# Patient Record
Sex: Female | Born: 1975 | Race: Black or African American | Hispanic: No | Marital: Married | Smoking: Never smoker
Health system: Southern US, Community
[De-identification: ages and names within clinical notes are randomized; demographics above are authoritative.]

## PROBLEM LIST (undated history)

## (undated) DIAGNOSIS — N183 Chronic kidney disease, stage 3 unspecified: Secondary | ICD-10-CM

## (undated) DIAGNOSIS — N840 Polyp of corpus uteri: Secondary | ICD-10-CM

## (undated) DIAGNOSIS — F329 Major depressive disorder, single episode, unspecified: Secondary | ICD-10-CM

## (undated) DIAGNOSIS — E739 Lactose intolerance, unspecified: Secondary | ICD-10-CM

## (undated) DIAGNOSIS — M25474 Effusion, right foot: Secondary | ICD-10-CM

## (undated) DIAGNOSIS — M25471 Effusion, right ankle: Secondary | ICD-10-CM

## (undated) DIAGNOSIS — F32A Depression, unspecified: Secondary | ICD-10-CM

## (undated) DIAGNOSIS — M549 Dorsalgia, unspecified: Secondary | ICD-10-CM

## (undated) DIAGNOSIS — R5383 Other fatigue: Secondary | ICD-10-CM

## (undated) DIAGNOSIS — E669 Obesity, unspecified: Secondary | ICD-10-CM

## (undated) DIAGNOSIS — F419 Anxiety disorder, unspecified: Secondary | ICD-10-CM

## (undated) DIAGNOSIS — N83201 Unspecified ovarian cyst, right side: Secondary | ICD-10-CM

## (undated) DIAGNOSIS — O139 Gestational [pregnancy-induced] hypertension without significant proteinuria, unspecified trimester: Secondary | ICD-10-CM

## (undated) DIAGNOSIS — K5900 Constipation, unspecified: Secondary | ICD-10-CM

## (undated) DIAGNOSIS — D509 Iron deficiency anemia, unspecified: Secondary | ICD-10-CM

## (undated) DIAGNOSIS — N289 Disorder of kidney and ureter, unspecified: Secondary | ICD-10-CM

## (undated) DIAGNOSIS — D649 Anemia, unspecified: Secondary | ICD-10-CM

## (undated) DIAGNOSIS — I10 Essential (primary) hypertension: Secondary | ICD-10-CM

## (undated) DIAGNOSIS — M25475 Effusion, left foot: Secondary | ICD-10-CM

## (undated) DIAGNOSIS — E559 Vitamin D deficiency, unspecified: Secondary | ICD-10-CM

## (undated) DIAGNOSIS — N938 Other specified abnormal uterine and vaginal bleeding: Secondary | ICD-10-CM

## (undated) HISTORY — DX: Constipation, unspecified: K59.00

## (undated) HISTORY — DX: Anxiety disorder, unspecified: F41.9

## (undated) HISTORY — DX: Effusion, right ankle: M25.471

## (undated) HISTORY — DX: Effusion, left foot: M25.475

## (undated) HISTORY — DX: Lactose intolerance, unspecified: E73.9

## (undated) HISTORY — DX: Major depressive disorder, single episode, unspecified: F32.9

## (undated) HISTORY — DX: Effusion, right ankle: M25.474

## (undated) HISTORY — DX: Disorder of kidney and ureter, unspecified: N28.9

## (undated) HISTORY — DX: Gestational (pregnancy-induced) hypertension without significant proteinuria, unspecified trimester: O13.9

## (undated) HISTORY — DX: Vitamin D deficiency, unspecified: E55.9

## (undated) HISTORY — PX: TUBAL LIGATION: SHX77

## (undated) HISTORY — DX: Other fatigue: R53.83

## (undated) HISTORY — DX: Dorsalgia, unspecified: M54.9

## (undated) HISTORY — DX: Depression, unspecified: F32.A

## (undated) HISTORY — DX: Essential (primary) hypertension: I10

## (undated) HISTORY — DX: Anemia, unspecified: D64.9

---

## 2000-10-23 DIAGNOSIS — O139 Gestational [pregnancy-induced] hypertension without significant proteinuria, unspecified trimester: Secondary | ICD-10-CM

## 2000-10-23 HISTORY — DX: Gestational (pregnancy-induced) hypertension without significant proteinuria, unspecified trimester: O13.9

## 2003-05-23 ENCOUNTER — Emergency Department (HOSPITAL_COMMUNITY): Admission: EM | Admit: 2003-05-23 | Discharge: 2003-05-23 | Payer: Self-pay | Admitting: Emergency Medicine

## 2003-10-03 ENCOUNTER — Emergency Department (HOSPITAL_COMMUNITY): Admission: EM | Admit: 2003-10-03 | Discharge: 2003-10-03 | Payer: Self-pay | Admitting: *Deleted

## 2005-12-22 ENCOUNTER — Emergency Department (HOSPITAL_COMMUNITY): Admission: EM | Admit: 2005-12-22 | Discharge: 2005-12-22 | Payer: Self-pay | Admitting: Emergency Medicine

## 2008-05-03 ENCOUNTER — Emergency Department (HOSPITAL_COMMUNITY): Admission: EM | Admit: 2008-05-03 | Discharge: 2008-05-03 | Payer: Self-pay | Admitting: Emergency Medicine

## 2010-12-05 ENCOUNTER — Observation Stay (HOSPITAL_COMMUNITY)
Admission: AD | Admit: 2010-12-05 | Discharge: 2010-12-06 | Disposition: A | Discharge status: Home / Self Care | Payer: Self-pay | Source: Ambulatory Visit | Attending: Obstetrics & Gynecology | Admitting: Obstetrics & Gynecology

## 2010-12-05 ENCOUNTER — Inpatient Hospital Stay (HOSPITAL_COMMUNITY): Payer: Self-pay

## 2010-12-05 DIAGNOSIS — R58 Hemorrhage, not elsewhere classified: Secondary | ICD-10-CM

## 2010-12-05 DIAGNOSIS — N938 Other specified abnormal uterine and vaginal bleeding: Principal | ICD-10-CM | POA: Insufficient documentation

## 2010-12-05 DIAGNOSIS — N949 Unspecified condition associated with female genital organs and menstrual cycle: Secondary | ICD-10-CM | POA: Insufficient documentation

## 2010-12-05 DIAGNOSIS — D500 Iron deficiency anemia secondary to blood loss (chronic): Secondary | ICD-10-CM | POA: Insufficient documentation

## 2010-12-05 DIAGNOSIS — R52 Pain, unspecified: Secondary | ICD-10-CM

## 2010-12-05 LAB — SAMPLE TO BLOOD BANK

## 2010-12-05 LAB — URINALYSIS, ROUTINE W REFLEX MICROSCOPIC
Bilirubin Urine: NEGATIVE
Ketones, ur: NEGATIVE mg/dL
Leukocytes, UA: NEGATIVE
Nitrite: NEGATIVE
Protein, ur: 100 mg/dL — AB
Specific Gravity, Urine: >1.03 — ABNORMAL HIGH (ref 1.005–1.030)
Urine Glucose, Fasting: NEGATIVE mg/dL
Urobilinogen, UA: 0.2 mg/dL (ref 0.0–1.0)
pH: 6 (ref 5.0–8.0)

## 2010-12-05 LAB — WET PREP, GENITAL
Clue Cells Wet Prep HPF POC: NONE SEEN
Trich, Wet Prep: NONE SEEN
Yeast Wet Prep HPF POC: NONE SEEN

## 2010-12-05 LAB — CBC
HCT: 19.4 % — ABNORMAL LOW (ref 36.0–46.0)
Hemoglobin: 5.6 g/dL — CL (ref 12.0–15.0)
MCH: 22 pg — ABNORMAL LOW (ref 26.0–34.0)
MCHC: 28.9 g/dL — ABNORMAL LOW (ref 30.0–36.0)
MCV: 76.4 fL — ABNORMAL LOW (ref 78.0–100.0)
Platelets: 347 10*3/uL (ref 150–400)
RBC: 2.54 MIL/uL — ABNORMAL LOW (ref 3.87–5.11)
RDW: 16.1 % — ABNORMAL HIGH (ref 11.5–15.5)
WBC: 7.6 10*3/uL (ref 4.0–10.5)

## 2010-12-05 LAB — URINE MICROSCOPIC-ADD ON

## 2010-12-05 LAB — ABO/RH: ABO/RH(D): O POS

## 2010-12-06 LAB — POCT PREGNANCY, URINE: Preg Test, Ur: NEGATIVE

## 2010-12-06 LAB — DIFFERENTIAL
Basophils Absolute: 0 10*3/uL (ref 0.0–0.1)
Basophils Relative: 0 % (ref 0–1)
Eosinophils Absolute: 0.1 10*3/uL (ref 0.0–0.7)
Eosinophils Relative: 1 % (ref 0–5)
Lymphocytes Relative: 26 % (ref 12–46)
Lymphs Abs: 2.7 10*3/uL (ref 0.7–4.0)
Monocytes Absolute: 0.7 10*3/uL (ref 0.1–1.0)
Monocytes Relative: 6 % (ref 3–12)
Neutro Abs: 7.1 10*3/uL (ref 1.7–7.7)
Neutrophils Relative %: 67 % (ref 43–77)

## 2010-12-06 LAB — CBC
HCT: 31.9 % — ABNORMAL LOW (ref 36.0–46.0)
Hemoglobin: 10.3 g/dL — ABNORMAL LOW (ref 12.0–15.0)
MCH: 25.6 pg — ABNORMAL LOW (ref 26.0–34.0)
MCHC: 32.3 g/dL (ref 30.0–36.0)
MCV: 79.4 fL (ref 78.0–100.0)
Platelets: 337 10*3/uL (ref 150–400)
RBC: 4.02 MIL/uL (ref 3.87–5.11)
RDW: 15.8 % — ABNORMAL HIGH (ref 11.5–15.5)
WBC: 10.6 10*3/uL — ABNORMAL HIGH (ref 4.0–10.5)

## 2010-12-06 LAB — GC/CHLAMYDIA PROBE AMP, GENITAL
Chlamydia, DNA Probe: NEGATIVE
GC Probe Amp, Genital: NEGATIVE

## 2010-12-07 LAB — CROSSMATCH
ABO/RH(D): O POS
Antibody Screen: NEGATIVE
Unit division: 0
Unit division: 0
Unit division: 0
Unit division: 0
Unit division: 0

## 2011-01-06 NOTE — Discharge Summary (Addendum)
  NAMEMIRONDA, Elizabeth Guerra               ACCOUNT NO.:  [id number]  MEDICAL RECORD NO.:  [id number]           PATIENT TYPE:  I  LOCATION:  [id number]                          FACILITY:  [facility]  PHYSICIAN:  Emily Filbert, MD        DATE OF BIRTH:  1976/02/18  DATE OF ADMISSION:  12/05/2010 DATE OF DISCHARGE:  12/06/2010                              DISCHARGE SUMMARY   ADMISSION DIAGNOSES: 1. Dysfunctional uterine bleeding. 2. Symptomatic anemia.  DISCHARGE DIAGNOSES: 1. Status post transfusion. 2. Possible adenomyosis.  DISCHARGE MEDICATIONS:  Phenergan 25 mg one tablet p.o. q.6 h. as needed, OCPs as instructed as written on a taper.  PERTINENT LABORATORIES:  She had a hemoglobin of 5.6 and hematocrit of 19.4 on admission.  She was transfused.  Post transfusion, hemoglobin 10.3 and hematocrit 31.9.  The patient was feeling better.  Bleeding was well controlled.  Ultrasound had thickened endometrium, a large 6-cm simple cyst on the left ovary most consistent with a functional ovarian cyst.  The endometrial thickening was 18 mm and the base of the endometrium and myometrium did appear to be normal.  Uterus is normal in size and echogenicity measuring 11 x 5.5 x 7 cm.  HOSPITAL COURSE:  The patient was admitted for symptomatic anemia, dysfunctional uterine bleeding.  The patient was transfused.  The patient was started on a high dose of OCPs.  During this admission, her bleeding was much decreased.  She was sent home with a taper of her OCPs.  There were no other complications during the hospital course. She was otherwise afebrile and stable.  The patient was discharged to home.  Discharge condition was stable.  Again, discharge medications are Phenergan 25 mg one tablet p.o. q.6 h. as needed and her OCP taper as written.  FOLLOWUP:  The patient is to follow up with her PCP in about 1-2 weeks for further recommendations and management.  ER WARNINGS:  The patient will return to the emergency  department if any fever, chills, nausea/vomiting, worsening bleeding, worsening abdominal pain, or any other concerning symptoms.    ______________________________ Willaim Bane, MD   ______________________________ Emily Filbert, MD    LC/MEDQ  D:  12/26/2010  T:  12/27/2010  Job:  [id number]  Electronically Signed by Willaim Bane MD on 01/06/2011 09:32:21 AM Electronically Signed by Clovia Cuff MD on 01/20/2011 11:22:11 AM

## 2011-01-09 ENCOUNTER — Encounter: Payer: Self-pay | Admitting: Obstetrics & Gynecology

## 2011-01-09 ENCOUNTER — Encounter (INDEPENDENT_AMBULATORY_CARE_PROVIDER_SITE_OTHER): Payer: Self-pay | Admitting: Obstetrics & Gynecology

## 2011-01-09 ENCOUNTER — Other Ambulatory Visit: Payer: Self-pay | Admitting: Obstetrics & Gynecology

## 2011-01-09 ENCOUNTER — Other Ambulatory Visit (HOSPITAL_COMMUNITY)
Admission: RE | Admit: 2011-01-09 | Discharge: 2011-01-09 | Disposition: A | Discharge status: Home / Self Care | Payer: Self-pay | Source: Ambulatory Visit | Attending: Obstetrics & Gynecology | Admitting: Obstetrics & Gynecology

## 2011-01-09 DIAGNOSIS — Z01419 Encounter for gynecological examination (general) (routine) without abnormal findings: Secondary | ICD-10-CM

## 2011-01-09 DIAGNOSIS — N938 Other specified abnormal uterine and vaginal bleeding: Secondary | ICD-10-CM | POA: Insufficient documentation

## 2011-01-09 DIAGNOSIS — N920 Excessive and frequent menstruation with regular cycle: Secondary | ICD-10-CM

## 2011-01-09 DIAGNOSIS — E669 Obesity, unspecified: Secondary | ICD-10-CM

## 2011-01-09 DIAGNOSIS — N949 Unspecified condition associated with female genital organs and menstrual cycle: Secondary | ICD-10-CM | POA: Insufficient documentation

## 2011-01-09 LAB — CONVERTED CEMR LAB
Trich, Wet Prep: NONE SEEN
Yeast Wet Prep HPF POC: NONE SEEN

## 2011-01-09 LAB — POCT PREGNANCY, URINE: Preg Test, Ur: NEGATIVE

## 2011-01-11 ENCOUNTER — Other Ambulatory Visit: Payer: Self-pay

## 2011-01-13 ENCOUNTER — Encounter: Payer: Self-pay | Admitting: Obstetrics & Gynecology

## 2011-01-13 ENCOUNTER — Other Ambulatory Visit: Payer: Self-pay

## 2011-01-13 DIAGNOSIS — Z0189 Encounter for other specified special examinations: Secondary | ICD-10-CM

## 2011-01-13 LAB — CONVERTED CEMR LAB
HCT: 27.6 % — ABNORMAL LOW (ref 36.0–46.0)
HCV Ab: NEGATIVE
HIV: NONREACTIVE
Hemoglobin: 8.3 g/dL — ABNORMAL LOW (ref 12.0–15.0)
Hepatitis B Surface Ag: NEGATIVE
MCHC: 30.1 g/dL (ref 30.0–36.0)
MCV: 75.8 fL — ABNORMAL LOW (ref 78.0–100.0)
Platelets: 435 10*3/uL — ABNORMAL HIGH (ref 150–400)
Prolactin: 17.8 ng/mL
RBC: 3.64 M/uL — ABNORMAL LOW (ref 3.87–5.11)
RDW: 15.8 % — ABNORMAL HIGH (ref 11.5–15.5)
TSH: 1.504 microintl units/mL (ref 0.350–4.500)
WBC: 6.3 10*3/uL (ref 4.0–10.5)

## 2011-01-13 NOTE — Progress Notes (Signed)
NAMEELLISON, Elizabeth Guerra               ACCOUNT NO.:  [id number]  MEDICAL RECORD NO.:  [id number]           PATIENT TYPE:  A  LOCATION:  [facility]                   FACILITY:  [facility]  PHYSICIAN:  Verita Schneiders, MD     DATE OF BIRTH:  08/03/1976  DATE OF SERVICE:  01/09/2011                                 CLINIC NOTE  REASON FOR VISIT:  Menometrorrhagia.  HISTORY OF PRESENT ILLNESS:  The patient is a 35 year old gravida 2, para 2 who is here today for evaluation of menometrorrhagia.  The patient was admitted in February for menometrorrhagia and symptomatic anemia, and she was also transfused during this visit and started on oral contraceptive pills.  She was told to follow up in the clinic for endometrial biopsy and further evaluation.  Since her admission, patient does state that she has been on the birth control pills; however, she feels like they are not helping with her bleeding, which has continued since then.  She denies any lightheadedness or any other symptoms of anemia at this point, but she is interested in getting worked up for this.  Of note, patient has not seen a gynecologist since December 2010 and is also requesting an annual exam and an STD screen.  She denies any STD exposure.  She just wants this as part of her preventative health maintenance.  The patient denies any other concerns.  PAST OB/GYN HISTORY:  The patient has had two cesarean sections.  Her menarche was at age 35.  She has irregular periods and with very heavy flow, associated with mild-to-moderate pain and sometimes severe.  She does endorse intermenstrual bleeding.  She has had a tubal ligation for contraception.  Her last Pap smear was in December 2010.  She denies any abnormal Pap smears.  She denies any sexually transmitted infections.  PAST MEDICAL HISTORY:  High blood pressure, obesity, and anemia.  PAST SURGICAL HISTORY:  Cesarean sections in 2000 and 2002.  MEDICATIONS:  Lisinopril,  hydrochlorothiazide, birth control pill, tramadol, and promethazine.  ALLERGIES:  AMOXICILLIN and LATEX.  SOCIAL HISTORY:  The patient works as a Recruitment consultant.  She lives with her two sons.  She does not smoke, drink alcohol, or use any illicit drugs.  FAMILY HISTORY:  Remarkable for an extensive history of diabetes, heart disease, and high blood pressure.  REVIEW OF SYSTEMS:  Remarkable for weakness, depression, anxiety, headache, shortness of breath, dizziness and lightheadedness in the past, leg cramps rarely, decreased appetite, nausea, vomiting, abdominal pain; with fussiness and mood swings.  PHYSICAL EXAMINATION:  VITAL SIGNS:  Temperature is 98.4, blood pressure is 161/111, pulse of 89, and weight 263.2 pounds. GENERAL:  No apparent distress. LUNGS:  Clear to auscultation bilaterally. HEART:  Regular rate and rhythm. NECK:  Supple.  Normal thyroid. BREASTS:  Symmetrically sized, pendulous.  No abnormal masses, skin changes, nipple drainage, or lymphadenopathy.  ABDOMEN:  Soft, nontender, and nondistended.  Well-healed Pfannenstiel incision. PELVIC:  Normal external female genitalia.  Pink, well-rugated vagina. The patient does have some red blood visualized in the vault, and some slow trickle of blood was noted from her cervix.  Pap smear sample was  obtained, and a sample was obtained for wet prep.  On bimanual exam, her uterus could not be palpated secondary to patient's habitus. EXTREMITIES:  No cyanosis, clubbing, or edema.  ENDOMETRIAL BIOPSY:  The patient was counseled regarding needing an endometrial biopsy.  Of note, she did have an ultrasound in February which showed an 80-mm endometrial stripe and a normal uterus that was at about 11-week size and a 5-cm simple cyst in the left ovary.  The patient did agree to do this endometrial biopsy, and she had a negative urine pregnancy test.  After the Pap smear was done, her cervix was swabbed with Betadine.  Tenaculum was  placed on the anterior lip of the cervix.  The Pipelle was advanced into the uterus to a depth of about 10 cm, and this was used to obtain a moderate amount of tissue on what looked like polypoid tissue.  This sample was taken to pathology for further analysis.  The patient had some bleeding after the procedure, but tolerated procedure well.  All the instruments were removed from the patient's pelvis.  ASSESSMENT/PLAN:  The patient is a 35 year old gravida 2, para 2; here for evaluation of menometrorrhagia.  The patient also had her annual examination.  We will follow up with her Pap smear and also follow up on her endometrial biopsy results.  For her menometrorrhagia, patient was given a prescription of Provera 10 mg twice a day and also diclofenac DR to help for her cramping.  The patient will return in 2 weeks to discuss the biopsy results and further management of her menometrorrhagia.  In the meantime, she was encouraged to continue her blood pressure medicine and follow up with her PCP for better blood pressure management.          ______________________________ Verita Schneiders, MD    UA/MEDQ  D:  01/09/2011  T:  01/10/2011  Job:  [id number]

## 2011-01-25 ENCOUNTER — Other Ambulatory Visit: Payer: Self-pay | Admitting: Family Medicine

## 2011-01-25 ENCOUNTER — Ambulatory Visit (INDEPENDENT_AMBULATORY_CARE_PROVIDER_SITE_OTHER): Payer: Self-pay | Admitting: Obstetrics and Gynecology

## 2011-01-25 DIAGNOSIS — N83209 Unspecified ovarian cyst, unspecified side: Secondary | ICD-10-CM

## 2011-01-25 DIAGNOSIS — N920 Excessive and frequent menstruation with regular cycle: Secondary | ICD-10-CM

## 2011-01-26 NOTE — Progress Notes (Signed)
NAMEGERIE, SCHEUER               ACCOUNT NO.:  [id number]  MEDICAL RECORD NO.:  [id number]           PATIENT TYPE:  A  LOCATION:  [facility]                   FACILITY:  [facility]  PHYSICIAN:  Candis Musa, CNM       DATE OF BIRTH:  [birth year]  DATE OF SERVICE:  01/25/2011                                 CLINIC NOTE  REASON FOR VISIT:  Followup menometrorrhagia.  HISTORY:  Ms. Maccini was evaluated here on January 09, 2011, for her abnormal bleeding.  She is here for results.  She is a 35 year old G2, P2, who had a BTL in 2002 who had about 4 months of menometrorrhagia and was admitted here in February due to her symptomatic anemia.  She was transfused and was given a prescription for oral contraceptives.  This was started, however, this was stopped at her January 09, 2011, visit due to her severe hypertension.  At the previous visit, she had an endometrial biopsy which was completely normal.  Pap which was negative. GC and Chlamydia negative.  TSH normal.  CBC significant for hemoglobin of 8.3, MCV 75.8.  HBsAg negative, hepatitis C negative, HIV nonreactive, prolactin normal at 17.8.  RPR nonreactive and wet prep was significant for a few clue cells and for that she was given a prescription for Flagyl by phone, and she states that she took the Provera prescribed to her on January 09, 2011, for only 2 days because she had heavier bleeding with severe cramps despite the diclofenac so she therefore self discontinued that after 2 days.  She does report complete cessation of any bleeding 1 week ago.  She still is having intermittent left pelvic pain which she says often starts in her left flank and radiates to the left groin.  This has been ongoing for about 3 months and of note on her ultrasound done in December 05, 2010, she did have a large left ovarian cyst which was thought to be a functional ovarian cyst.  She has not had this followed up yet.  She is a school bus driver and is concerned  that she is sleeping very poorly only 3 hours a night because of being stressed out having 2 autistic children.  She has also had some crying spells lately.  She has lived with different partner and has a long-term plan to lose 80 pounds through [employer] in which she has already enrolled and then considering having a tubal re- anastomosis and have another child with this partner.  However on further discussion, she says if she had another episode of severe bleeding.  She would go ahead and consider the endometrial ablation instead of this plan.  For now, she wants to do expectant management.  OBJECTIVE:  BP today is 170-174/95-104.  Her weight is 264.9.  She appears in no distress.  Pelvic exam is deferred today.  IMPRESSION AND PLAN:  A 35 year old G2, P2 with morbid obesity. Menometrorrhagia without apparent cause.  We discussed her options for taking Provera on a regular basis or Depo-Provera and she elects to do expectant management for now.  She will continue high iron foods and iron supplementation.  She has diclofenac still giving her prescription today for Ambien 10 mg as needed for sleep and also discussed other measures to improve sleep and deal with her stress.  She is advised to go to her primary physician as soon as possible to adjust her antihypertensive medication and followup of her possible mood disorder and sleep disruption.  She is advised to keep a bleeding calendar, and she is getting an ultrasound to follow up the cyst in mid May and we will see [place] in June.          ______________________________ Candis Musa, CNM    DP/MEDQ  D:  01/25/2011  T:  01/26/2011  Job:  [id number]

## 2011-03-08 ENCOUNTER — Emergency Department (HOSPITAL_COMMUNITY)
Admission: EM | Admit: 2011-03-08 | Discharge: 2011-03-08 | Disposition: A | Discharge status: Home / Self Care | Payer: Self-pay | Attending: Emergency Medicine | Admitting: Emergency Medicine

## 2011-03-08 ENCOUNTER — Ambulatory Visit (HOSPITAL_COMMUNITY): Payer: Self-pay

## 2011-03-08 DIAGNOSIS — L02419 Cutaneous abscess of limb, unspecified: Secondary | ICD-10-CM | POA: Insufficient documentation

## 2011-03-08 DIAGNOSIS — L03119 Cellulitis of unspecified part of limb: Secondary | ICD-10-CM | POA: Insufficient documentation

## 2011-03-08 DIAGNOSIS — I10 Essential (primary) hypertension: Secondary | ICD-10-CM | POA: Insufficient documentation

## 2011-03-10 ENCOUNTER — Ambulatory Visit (HOSPITAL_COMMUNITY)
Admission: RE | Admit: 2011-03-10 | Discharge: 2011-03-10 | Disposition: A | Discharge status: Home / Self Care | Payer: Self-pay | Source: Ambulatory Visit | Attending: Family Medicine | Admitting: Family Medicine

## 2011-03-10 DIAGNOSIS — N83209 Unspecified ovarian cyst, unspecified side: Secondary | ICD-10-CM | POA: Insufficient documentation

## 2011-03-10 DIAGNOSIS — D259 Leiomyoma of uterus, unspecified: Secondary | ICD-10-CM | POA: Insufficient documentation

## 2011-03-26 ENCOUNTER — Inpatient Hospital Stay (INDEPENDENT_AMBULATORY_CARE_PROVIDER_SITE_OTHER)
Admission: RE | Admit: 2011-03-26 | Discharge: 2011-03-26 | Disposition: A | Discharge status: Home / Self Care | Payer: Self-pay | Source: Ambulatory Visit | Attending: Emergency Medicine | Admitting: Emergency Medicine

## 2011-03-26 DIAGNOSIS — S139XXA Sprain of joints and ligaments of unspecified parts of neck, initial encounter: Secondary | ICD-10-CM

## 2011-03-26 DIAGNOSIS — L723 Sebaceous cyst: Secondary | ICD-10-CM

## 2011-03-26 DIAGNOSIS — L089 Local infection of the skin and subcutaneous tissue, unspecified: Secondary | ICD-10-CM

## 2011-03-30 LAB — WOUND CULTURE

## 2011-04-28 ENCOUNTER — Other Ambulatory Visit: Payer: Self-pay | Admitting: Obstetrics and Gynecology

## 2011-04-28 DIAGNOSIS — N838 Other noninflammatory disorders of ovary, fallopian tube and broad ligament: Secondary | ICD-10-CM

## 2011-05-04 ENCOUNTER — Other Ambulatory Visit (HOSPITAL_COMMUNITY): Payer: Self-pay

## 2011-07-20 ENCOUNTER — Ambulatory Visit: Payer: Self-pay | Admitting: Gynecology

## 2011-07-20 LAB — DIFFERENTIAL
Basophils Absolute: 0
Basophils Relative: 0
Eosinophils Absolute: 0.2
Eosinophils Relative: 3
Lymphocytes Relative: 29
Lymphs Abs: 1.7
Monocytes Absolute: 0.5
Monocytes Relative: 9
Neutro Abs: 3.4
Neutrophils Relative %: 59

## 2011-07-20 LAB — CBC
HCT: 36.4
Hemoglobin: 11.8 — ABNORMAL LOW
MCHC: 32.6
MCV: 85.8
Platelets: 282
RBC: 4.24
RDW: 13.1
WBC: 5.9

## 2011-07-20 LAB — COMPREHENSIVE METABOLIC PANEL
ALT: 23
AST: 25
Albumin: 3.6
Alkaline Phosphatase: 50
BUN: 8
CO2: 30
Calcium: 9.5
Chloride: 99
Creatinine, Ser: 1.24 — ABNORMAL HIGH
GFR calc Af Amer: >60
GFR calc non Af Amer: 51 — ABNORMAL LOW
Glucose, Bld: 96
Potassium: 3.1 — ABNORMAL LOW
Sodium: 138
Total Bilirubin: 1
Total Protein: 7.2

## 2011-07-20 LAB — URINALYSIS, ROUTINE W REFLEX MICROSCOPIC
Bilirubin Urine: NEGATIVE
Glucose, UA: NEGATIVE
Hgb urine dipstick: NEGATIVE
Ketones, ur: NEGATIVE
Nitrite: NEGATIVE
Protein, ur: NEGATIVE
Specific Gravity, Urine: 1.024
Urobilinogen, UA: 0.2
pH: 6.5

## 2011-07-20 LAB — PREGNANCY, URINE: Preg Test, Ur: NEGATIVE

## 2011-07-20 LAB — LIPASE, BLOOD: Lipase: 16

## 2011-07-20 LAB — RPR: RPR Ser Ql: NONREACTIVE

## 2011-08-07 ENCOUNTER — Ambulatory Visit: Payer: Self-pay | Admitting: Gynecology

## 2011-08-07 ENCOUNTER — Encounter: Payer: Self-pay | Admitting: Gynecology

## 2011-08-07 ENCOUNTER — Ambulatory Visit (INDEPENDENT_AMBULATORY_CARE_PROVIDER_SITE_OTHER): Payer: BC Managed Care – PPO | Admitting: Gynecology

## 2011-08-07 VITALS — BP 136/92 | Ht 63.75 in | Wt 254.0 lb

## 2011-08-07 DIAGNOSIS — N898 Other specified noninflammatory disorders of vagina: Secondary | ICD-10-CM

## 2011-08-07 DIAGNOSIS — N920 Excessive and frequent menstruation with regular cycle: Secondary | ICD-10-CM

## 2011-08-07 DIAGNOSIS — N946 Dysmenorrhea, unspecified: Secondary | ICD-10-CM

## 2011-08-07 DIAGNOSIS — N949 Unspecified condition associated with female genital organs and menstrual cycle: Secondary | ICD-10-CM

## 2011-08-07 DIAGNOSIS — R102 Pelvic and perineal pain: Secondary | ICD-10-CM

## 2011-08-07 DIAGNOSIS — N83209 Unspecified ovarian cyst, unspecified side: Secondary | ICD-10-CM

## 2011-08-07 DIAGNOSIS — B373 Candidiasis of vulva and vagina: Secondary | ICD-10-CM

## 2011-08-07 MED ORDER — TERCONAZOLE 0.8 % VA CREA: 1.0000 | TOPICAL_CREAM | Freq: Every day | VAGINAL | Status: AC

## 2011-08-07 NOTE — Progress Notes (Signed)
35 year old G42 P2 female status post tubal sterilization presents with a history of menorrhagia and ovarian cysts discovered earlier this year. She had very heavy bleeding was evaluated at [facility] with ultrasound showing an ovarian cyst and an endometrial biopsy was done which was benign. She was started on Provera and a subsequent oral contraceptives she took these transiently her bleeding resolved but she does persist to have monthly heavy periods. She also is having more cramping with her menses and now over the last week or 2 some lower pelvic discomfort.  Her last ultrasound in May showed 2 simple left ovarian cysts one measuring 6.6 cm and the other 3.4. She was recommended to have a follow scan possibly MRI but did not follow up with him due to lack of insurance. She also notes recurrent yeast infections almost on a monthly basis following her menses and currently has a vaginal discharge with some itching.  Exam directed towards her complaint Abdomen soft nontender without masses guarding rebound organomegaly Pelvic external BUS vagina with white discharge KOH wet prep done, cervix normal, uterus a midline mobile grossly normal, adnexa without gross masses or tenderness rectovaginal exam is normal exam is somewhat limited by abdominal girth.   Assessment and plan: 1. Vaginal discharge. KOH wet prep is positive for yeast we'll treat with Terazol 3 cream follow up if symptoms persist or recur 2. History of ovarian cyst, menorrhagia dysmenorrhea pelvic pain. We'll start with sonohysterogram in follow up both of the ovaries as well as the endometrium. She does have a lab slip from a weight loss clinic where she is going to [facility] to have several bloods drawn to include a CBC and thyroid if she could bring me a copy of these results also. Patient will follow her for her ultrasound and we'll go from there. Patient does note that she is not interested in hysterectomy that she is actually  considering a tubal reversal.

## 2011-09-08 ENCOUNTER — Ambulatory Visit: Payer: BC Managed Care – PPO | Admitting: Gynecology

## 2011-09-08 ENCOUNTER — Other Ambulatory Visit: Payer: BC Managed Care – PPO

## 2011-10-03 ENCOUNTER — Other Ambulatory Visit: Payer: Self-pay | Admitting: *Deleted

## 2012-04-09 ENCOUNTER — Ambulatory Visit: Payer: BC Managed Care – PPO | Admitting: Gynecology

## 2012-05-23 IMAGING — US US PELVIS COMPLETE
1 series · 14 of 25 positions shown · non-contrast
Comparison: None.

CLINICAL DATA: Pelvic pain, vaginal bleeding

TRANSABDOMINAL ULTRASOUND OF PELVIS
TECHNIQUE: Transabdominal ultrasound examination of the pelvis was
performed including evaluation of the uterus, ovaries, adnexal
regions, and pelvic cul-de-sac.

[Series 1: us pelvis complete · 14 of 54 slices shown]
[im 1/54]
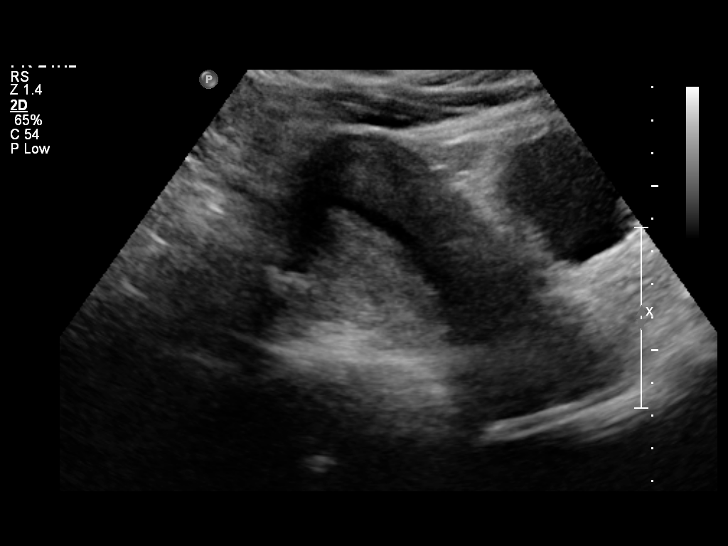
[im 5/54]
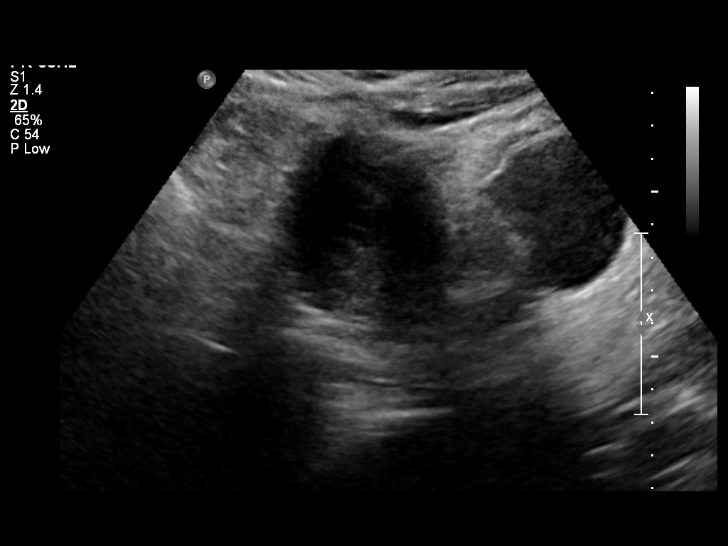
[im 9/54]
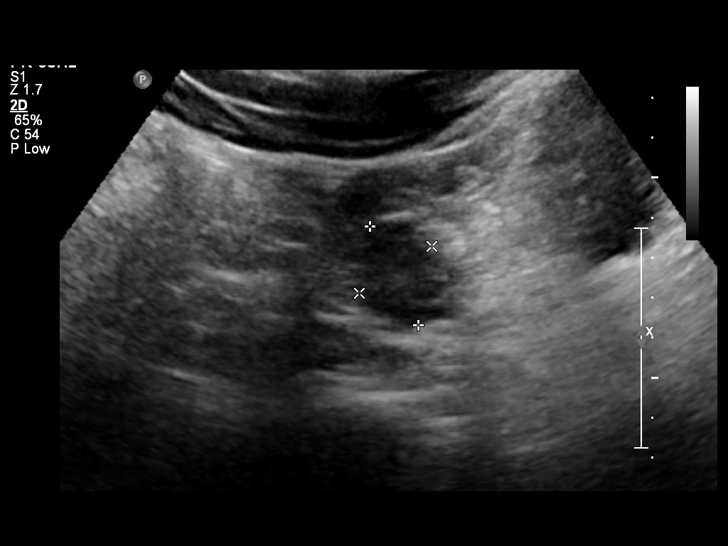
[im 14/54]
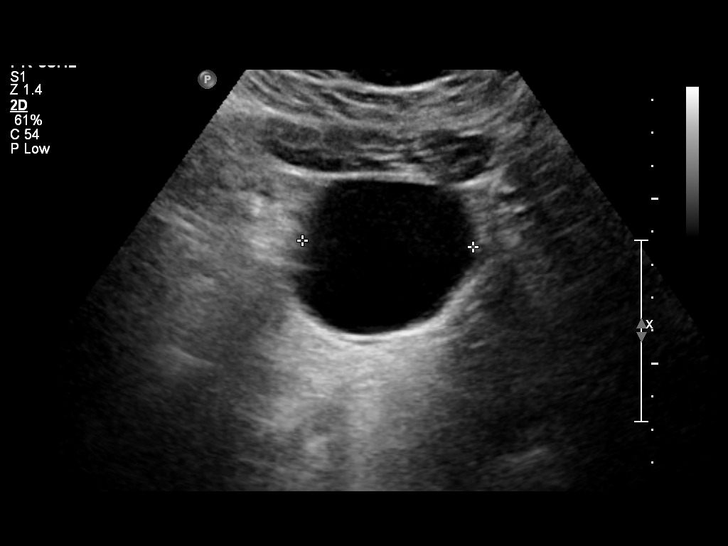
[im 18/54]
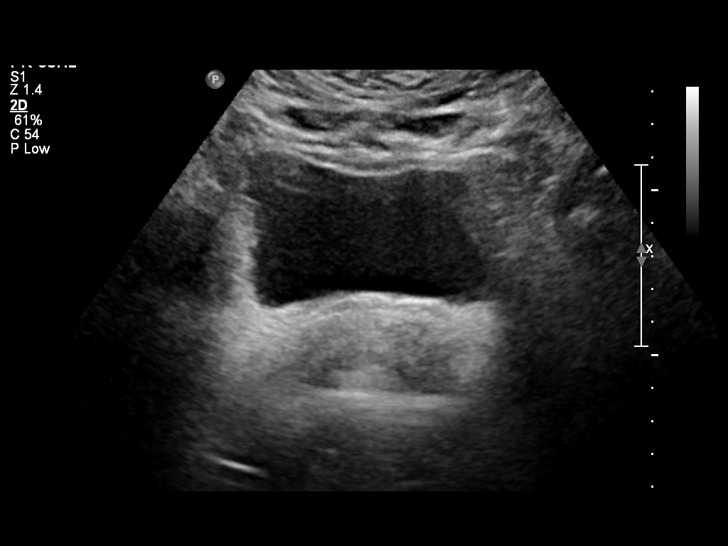
[im 20/54]
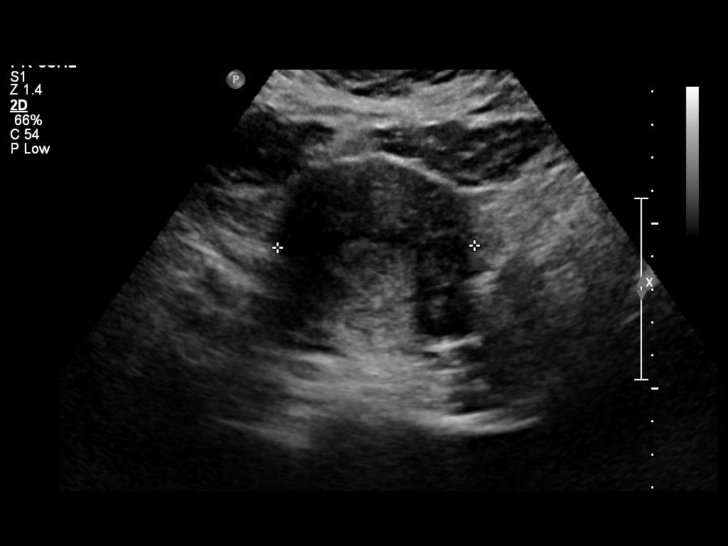
[im 25/54]
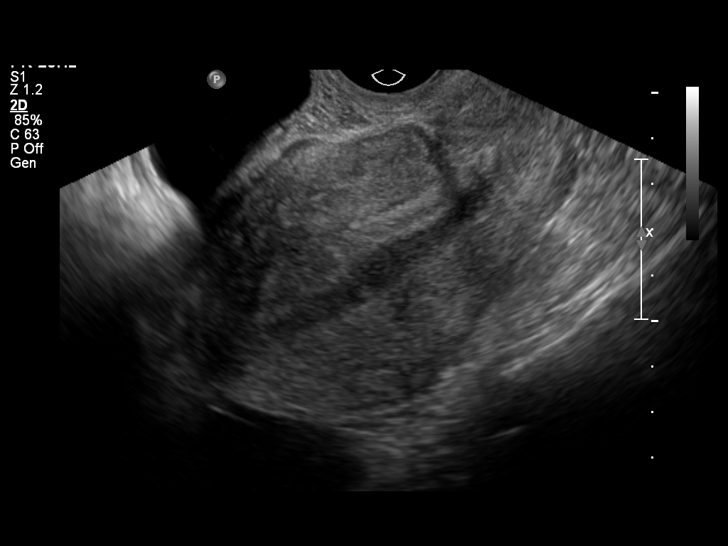
[im 29/54]
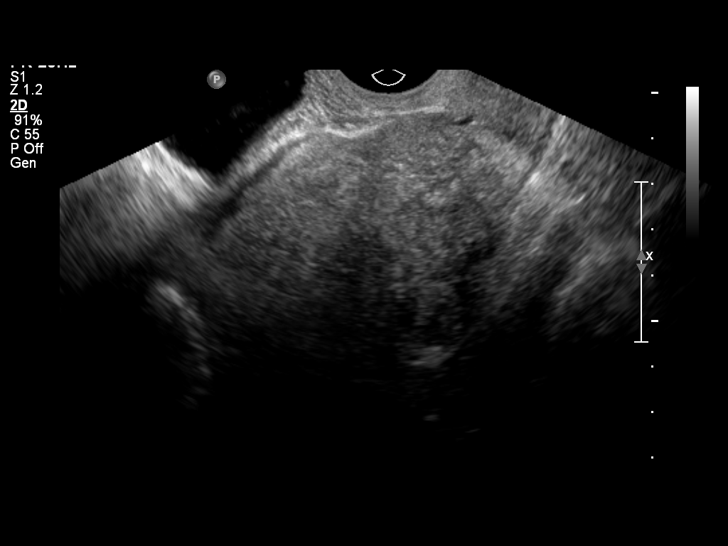
[im 34/54]
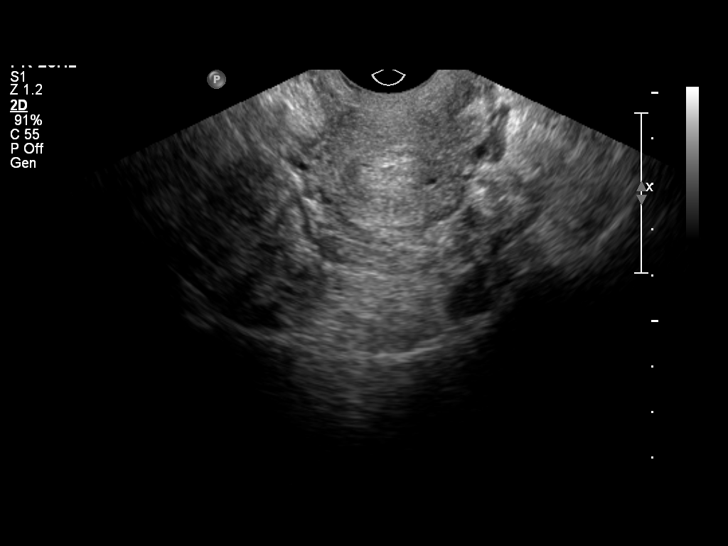
[im 36/54]
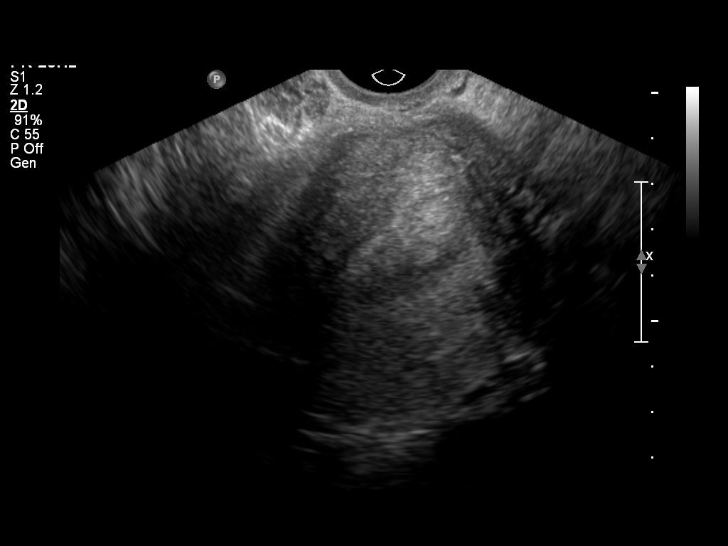
[im 40/54]
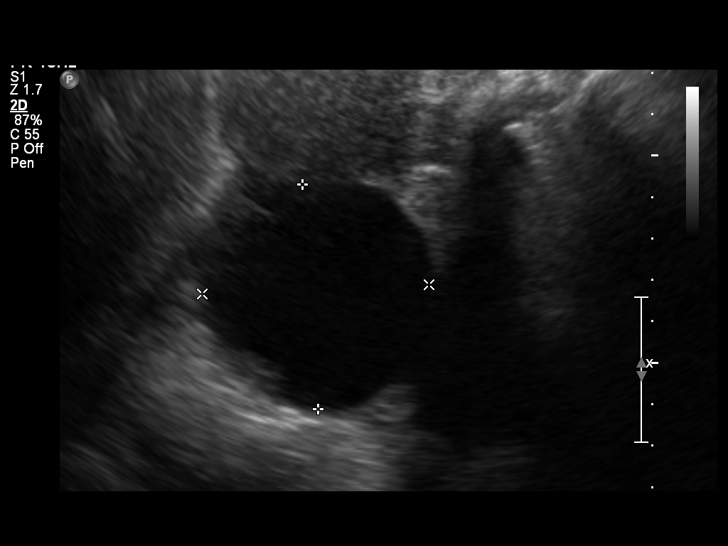
[im 45/54]
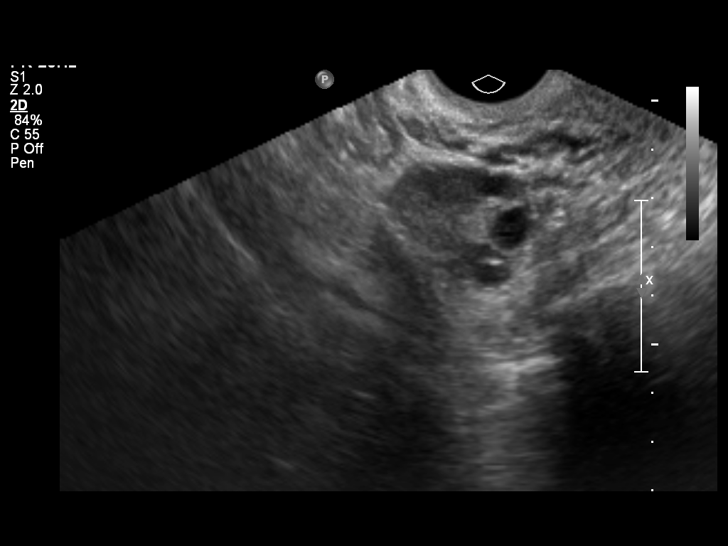
[im 49/54]
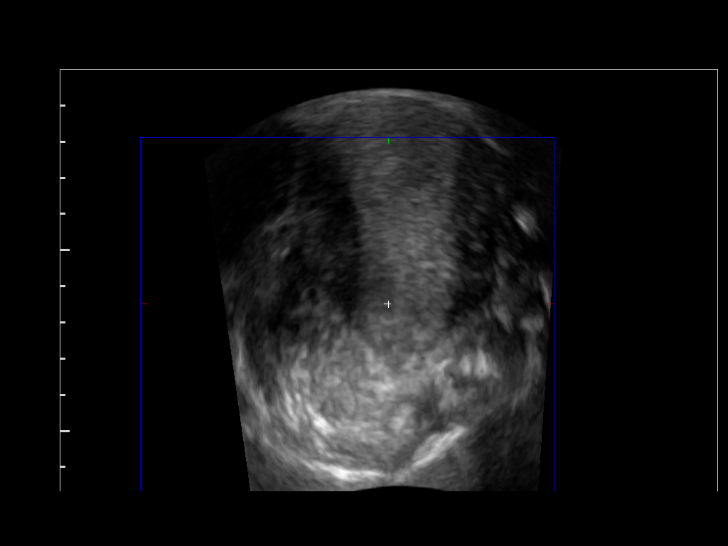
[im 54/54]
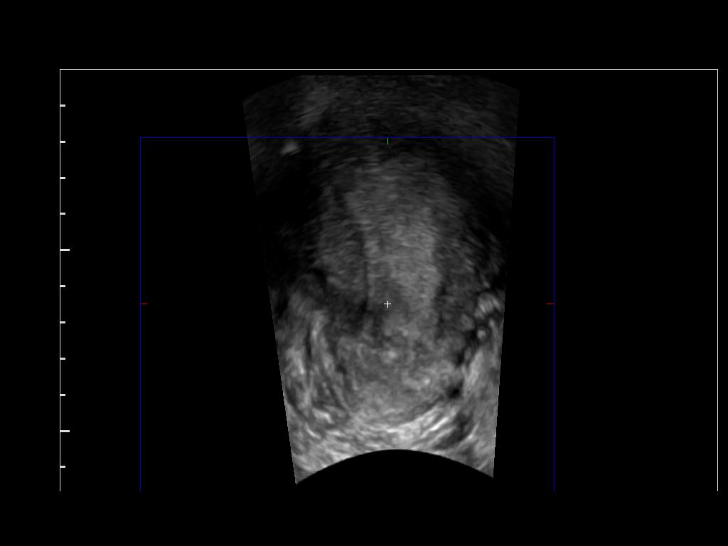

[14 of 25 positions shown; findings below may reference images not displayed]

FINDINGS: Uterus normal in size and echogenicity measuring 11.0 x 5.5 x
cm.

Endometrium endometrium is thickened to 18 mm.   The interface of
the endometrium and the myometrium appear normal.

Right Ovary normal

Left Ovary there is a 5.5 x 6.0 by 4.6 cm anechoic cyst associated
with the right ovary.  This cyst is thin-walled without complexity.

Other Findings:  Small amount free fluid.
IMPRESSION: 1.  Thickened endometrium.
2.  Large 6 cm simple cyst in the left ovary most consistent with a
functional ovarian cyst.  Consider follow-up ultrasound in 12
months.

Findings discussed with Dr. Ansitho on 12/05/2010 at 20 45 hours

## 2012-08-26 IMAGING — US US TRANSVAGINAL NON-OB
1 series · 14 of 25 positions shown · non-contrast
Comparison: December 05, 2010

CLINICAL DATA: Follow-up left ovarian cyst

TRANSVAGINAL ULTRASOUND OF PELVIS
TECHNIQUE: Transvaginal ultrasound examination of the pelvis was
performed including evaluation of the uterus, ovaries, adnexal
regions, and pelvic cul-de-sac.

[Series 1: us transvaginal non-ob · 14 of 50 slices shown]
[im 1/50]
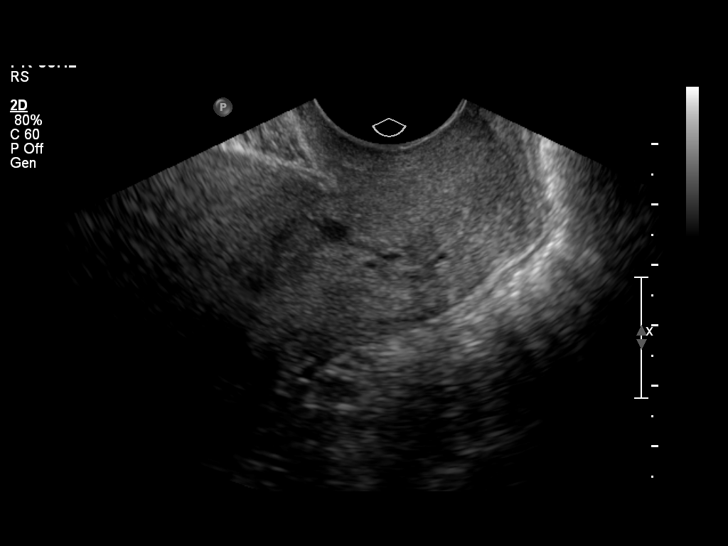
[im 5/50]
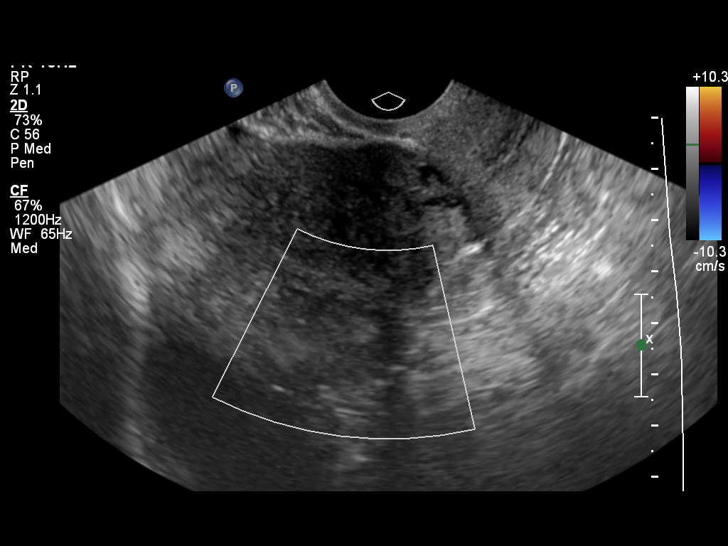
[im 9/50]
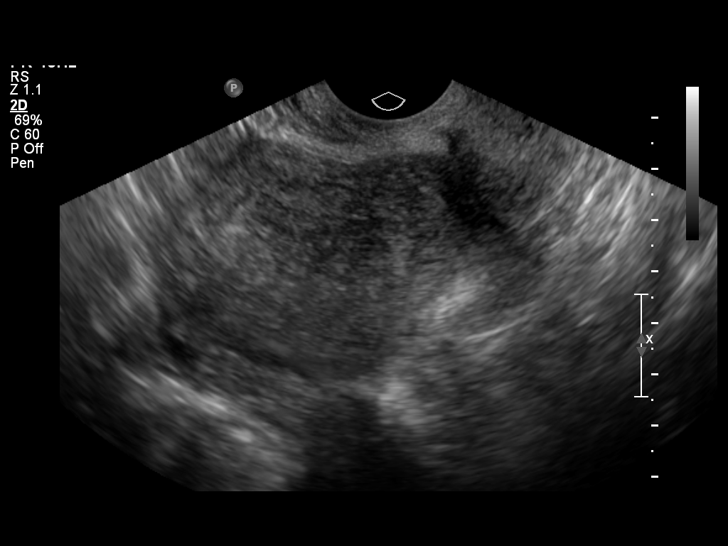
[im 13/50]
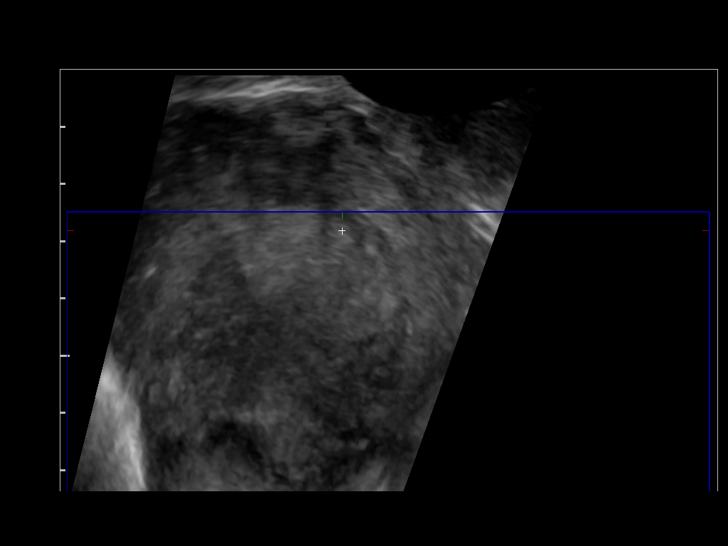
[im 17/50]
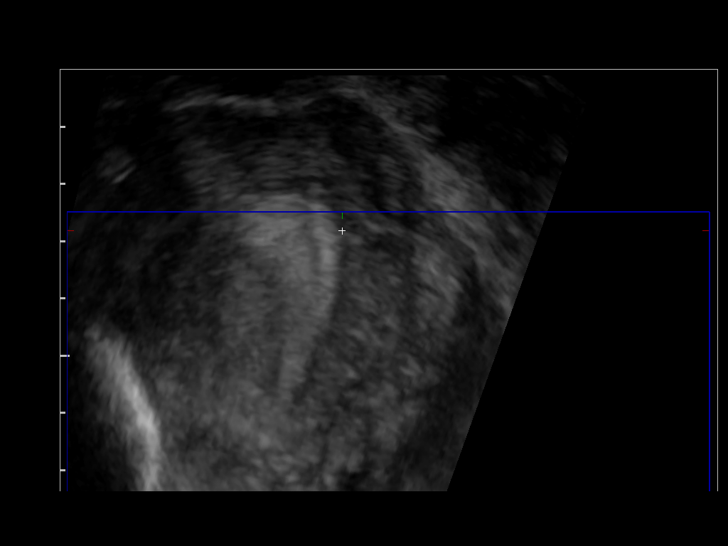
[im 19/50]
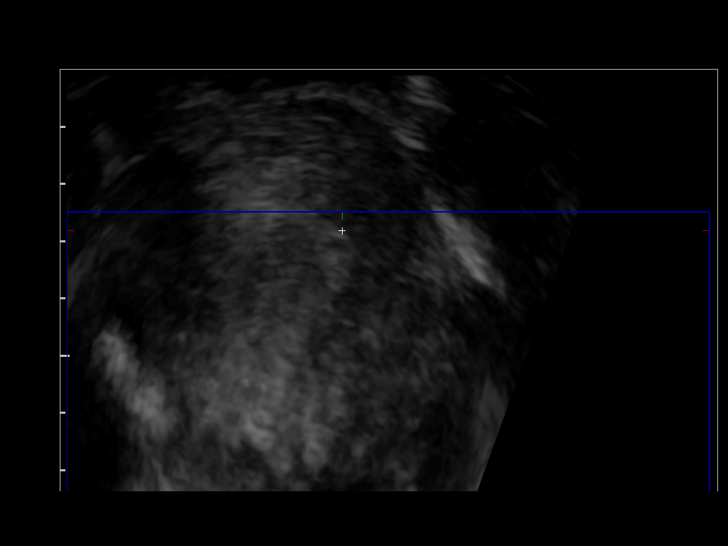
[im 23/50]
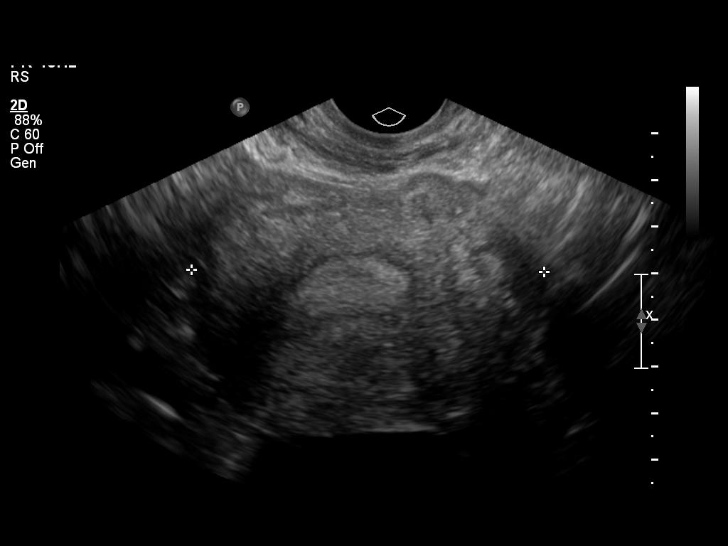
[im 27/50]
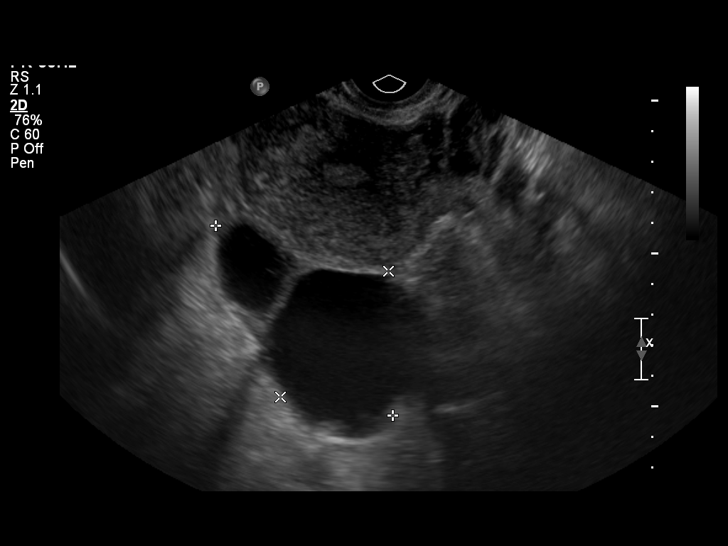
[im 31/50]
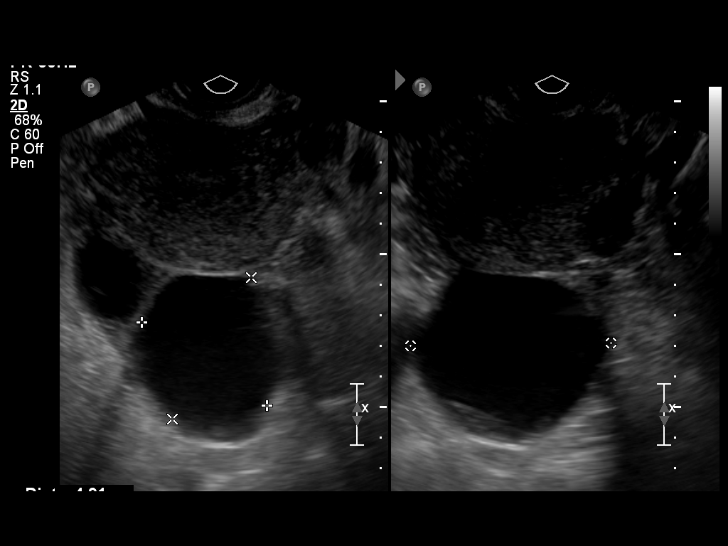
[im 33/50]
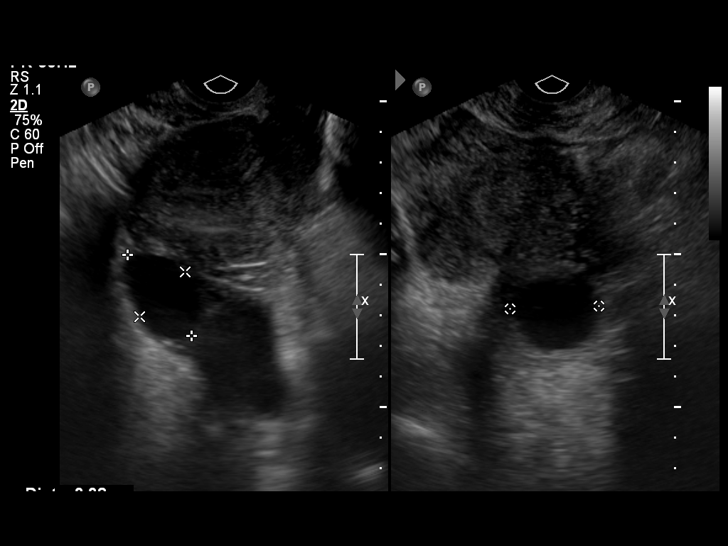
[im 37/50]
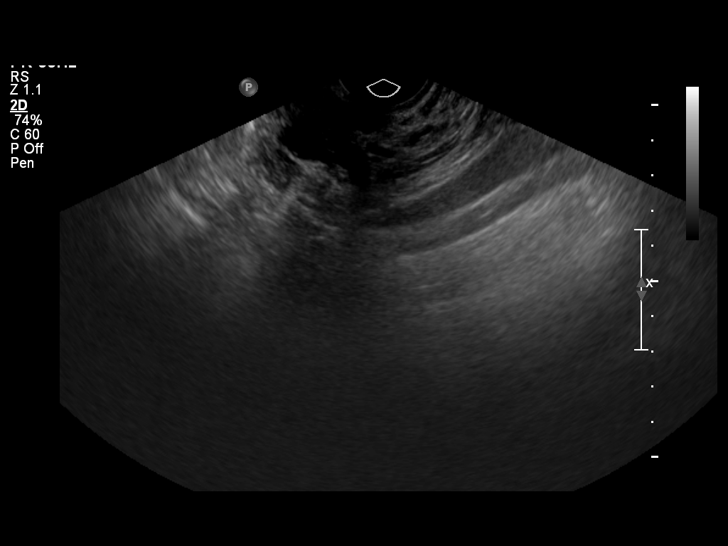
[im 41/50]
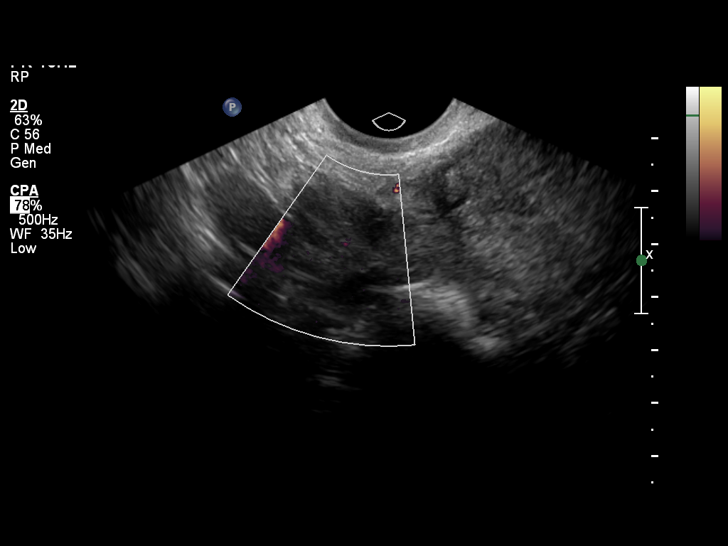
[im 45/50]
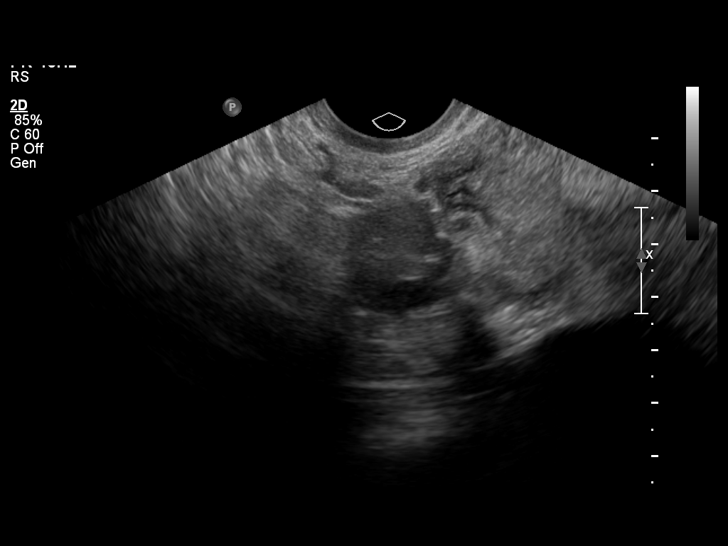
[im 50/50]
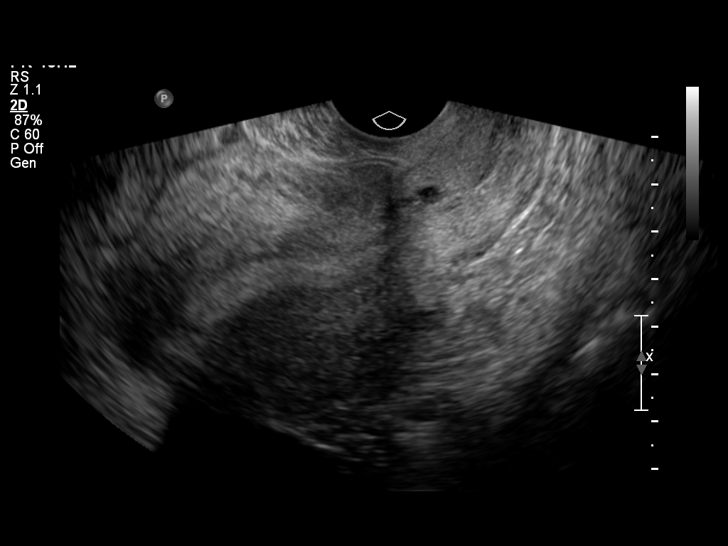

[14 of 25 positions shown; findings below may reference images not displayed]

FINDINGS: There is a 1.9 cm myometrial fibroid at the uterine
fundus.  The uterus is normal in overall size, measuring 11.0 x
x 7.6 cm.  Endometrial stripe is thin and homogeneous, measuring 13
mm in width.

On the left ovary, there are two simple appearing cysts which
measure 6.6 cm and 3.4 cm.  The overall left ovarian dimensions are
8.5 x 5.4 x 7.0 cm.  The right ovary has a normal size and
appearance, measuring 3.1 x 1.8 x 1.8 cm.  There is no free pelvic
fluid.
IMPRESSION: There is a persistent 6.6 cm cyst on the left ovary which is
slightly larger than on the prior study.  An additional 3.4 cm left
ovarian cyst is also now present.  Further characterization with
MRI or another short-term sonographic follow-up in 6 - 8 weeks is
recommended.

Uterine fibroid.

## 2012-09-04 ENCOUNTER — Ambulatory Visit (INDEPENDENT_AMBULATORY_CARE_PROVIDER_SITE_OTHER): Payer: Self-pay | Admitting: Obstetrics & Gynecology

## 2012-09-04 ENCOUNTER — Encounter: Payer: Self-pay | Admitting: Obstetrics & Gynecology

## 2012-09-04 ENCOUNTER — Other Ambulatory Visit (HOSPITAL_COMMUNITY)
Admission: RE | Admit: 2012-09-04 | Discharge: 2012-09-04 | Disposition: A | Discharge status: Home / Self Care | Payer: Self-pay | Source: Ambulatory Visit | Attending: Obstetrics & Gynecology | Admitting: Obstetrics & Gynecology

## 2012-09-04 VITALS — BP 154/94 | HR 72 | Temp 98.0°F | Ht 64.0 in | Wt 259.9 lb

## 2012-09-04 DIAGNOSIS — N8500 Endometrial hyperplasia, unspecified: Secondary | ICD-10-CM | POA: Insufficient documentation

## 2012-09-04 DIAGNOSIS — N920 Excessive and frequent menstruation with regular cycle: Secondary | ICD-10-CM

## 2012-09-04 DIAGNOSIS — Z01812 Encounter for preprocedural laboratory examination: Secondary | ICD-10-CM

## 2012-09-04 LAB — POCT PREGNANCY, URINE: Preg Test, Ur: NEGATIVE

## 2012-09-04 MED ORDER — NORGESTIMATE-ETH ESTRADIOL 0.25-35 MG-MCG PO TABS: 1.0000 | ORAL_TABLET | Freq: Every day | ORAL | Status: DC

## 2012-09-04 NOTE — Progress Notes (Signed)
States here again for bleeding problems. States will skip a period then bleed for 4 months. States has been having bleeding issues for about 9 years, will have normal periods for a while then skip and then have bleeding for a long period.

## 2012-09-04 NOTE — Progress Notes (Signed)
Patient ID: Elizabeth Guerra, female   DOB: 05-03-1976, 36 y.o.   MRN: [id number] Pt with h/o heavy cycles x 2 weeks.  She reports large clots and lots of heavy daily bleeding. This occurred last year and resolved with OCP's.  Then sx have returned.    The indications for endometrial biopsy were reviewed.   Risks of the biopsy including cramping, bleeding, infection, uterine perforation, inadequate specimen and need for additional procedures  were discussed. The patient states she understands and agrees to undergo procedure today. Consent was signed. Time out was performed. Urine HCG was negative. A sterile speculum was placed in the patient's vagina and the cervix was prepped with Betadine. A single-toothed tenaculum was placed on the anterior lip of the cervix to stabilize it. The 3 mm pipelle was introduced into the endometrial cavity without difficulty to a depth of 7cm, and a moderate amount of tissue was obtained and sent to pathology. The instruments were removed from the patient's vagina. Minimal bleeding from the cervix was noted. The patient tolerated the procedure well. Routine post-procedure instructions were given to the patient. The patient will follow up to review the results and for further management.    Rx Sprintec 2 po bid x 4 days then 1 po bid x 4 days then 1 po q day F/u 4 weeks to review endobx results and check on bleeding  Catrice Zuleta L. Harraway-Smith, M.D., Cherlynn June

## 2012-09-04 NOTE — Patient Instructions (Addendum)
Menorrhagia Dysfunctional uterine bleeding is different from a normal menstrual period. When periods are heavy or there is more bleeding than is usual for you, it is called menorrhagia. It may be caused by hormonal imbalance, or physical, metabolic, or other problems. Examination is necessary in order that your caregiver may treat treatable causes. If this is a continuing problem, a D&C may be needed. That means that the cervix (the opening of the uterus or womb) is dilated (stretched larger) and the lining of the uterus is scraped out. The tissue scraped out is then examined under a microscope by a specialist (pathologist) to make sure there is nothing of concern that needs further or more extensive treatment. HOME CARE INSTRUCTIONS   If medications were prescribed, take exactly as directed. Do not change or switch medications without consulting your caregiver.  Long term heavy bleeding may result in iron deficiency. Your caregiver may have prescribed iron pills. They help replace the iron your body lost from heavy bleeding. Take exactly as directed. Iron may cause constipation. If this becomes a problem, increase the bran, fruits, and roughage in your diet.  Do not take aspirin or medicines that contain aspirin one week before or during your menstrual period. Aspirin may make the bleeding worse.  If you need to change your sanitary pad or tampon more than once every 2 hours, stay in bed and rest as much as possible until the bleeding stops.  Eat well-balanced meals. Eat foods high in iron. Examples are leafy green vegetables, meat, liver, eggs, and whole grain breads and cereals. Do not try to lose weight until the abnormal bleeding has stopped and your blood iron level is back to normal. SEEK MEDICAL CARE IF:   You need to change your sanitary pad or tampon more than once an hour.  You develop nausea (feeling sick to your stomach) and vomiting, dizziness, or diarrhea while you are taking your  medicine.  You have any problems that may be related to the medicine you are taking. SEEK IMMEDIATE MEDICAL CARE IF:   You have a fever.  You develop chills.  You develop severe bleeding or start to pass blood clots.  You feel dizzy or faint. MAKE SURE YOU:   Understand these instructions.  Will watch your condition.  Will get help right away if you are not doing well or get worse. Document Released: 10/09/2005 Document Revised: 01/01/2012 Document Reviewed: 05/29/2008 [facility] Patient Information 2013 [place].

## 2012-09-10 ENCOUNTER — Telehealth: Payer: Self-pay | Admitting: Medical

## 2012-09-10 NOTE — Telephone Encounter (Signed)
Message copied by Farris Has on Tue Sep 10, 2012  9:55 AM ------      Message from: Lavonia Drafts      Created: Mon Sep 09, 2012  3:15 PM       Please call pt.  Notify her of normal Endo Bx and to continue the Sprintec as prescribed.            clh-S

## 2012-09-10 NOTE — Telephone Encounter (Signed)
LM for patient to return call to clinic. Needs to be informed of normal endo bx results and recommendations.

## 2012-09-11 NOTE — Telephone Encounter (Signed)
Called pt and left message to return the call to the clinics.

## 2012-09-12 NOTE — Telephone Encounter (Signed)
Called pt and left message that this our third attempt in trying to contact her and that a certified letter will be sent to please contact the clinics.  Certified letter sent.

## 2012-10-02 ENCOUNTER — Encounter: Payer: Self-pay | Admitting: Obstetrics & Gynecology

## 2012-10-02 ENCOUNTER — Ambulatory Visit (INDEPENDENT_AMBULATORY_CARE_PROVIDER_SITE_OTHER): Payer: Self-pay | Admitting: Obstetrics & Gynecology

## 2012-10-02 VITALS — BP 150/96 | HR 83 | Temp 98.6°F | Ht 65.0 in | Wt 260.1 lb

## 2012-10-02 DIAGNOSIS — E282 Polycystic ovarian syndrome: Secondary | ICD-10-CM

## 2012-10-02 DIAGNOSIS — N938 Other specified abnormal uterine and vaginal bleeding: Secondary | ICD-10-CM

## 2012-10-02 DIAGNOSIS — N949 Unspecified condition associated with female genital organs and menstrual cycle: Secondary | ICD-10-CM

## 2012-10-02 MED ORDER — METFORMIN HCL 500 MG PO TABS: 500.0000 mg | ORAL_TABLET | Freq: Two times a day (BID) | ORAL | Status: DC

## 2012-10-02 MED ORDER — MEGESTROL ACETATE 40 MG PO TABS: 40.0000 mg | ORAL_TABLET | Freq: Every day | ORAL | Status: DC

## 2012-10-02 NOTE — Patient Instructions (Addendum)
Obesity Obesity is defined as having too much total body fat and a body mass index (BMI) of 30 or more. BMI is an estimate of body fat and is calculated from your height and weight. Obesity happens when you consume more calories than you can burn by exercising or performing daily physical tasks. Prolonged obesity can cause major illnesses or emergencies, such as:   A stroke.  Heart disease.  Diabetes.  Cancer.  Arthritis.  High blood pressure (hypertension).  High cholesterol.  Sleep apnea.  Erectile dysfunction.  Infertility problems. CAUSES   Regularly eating unhealthy foods.  Physical inactivity.  Certain disorders, such as an underactive thyroid (hypothyroidism), Cushing's syndrome, and polycystic ovarian syndrome.  Certain medicines, such as steroids, some depression medicines, and antipsychotics.  Genetics.  Lack of sleep. DIAGNOSIS  A caregiver can diagnose obesity after calculating your BMI. Obesity will be diagnosed if your BMI is 30 or higher.  There are other methods of measuring obesity levels. Some other methods include measuring your skin fold thickness, your waist circumference, and comparing your hip circumference to your waist circumference. TREATMENT  A healthy treatment program includes some or all of the following:  Long-term dietary changes.  Exercise and physical activity.  Behavioral and lifestyle changes.  Medicine only under the supervision of your caregiver. Medicines may help, but only if they are used with diet and exercise programs. An unhealthy treatment program includes:  Fasting.  Fad diets.  Supplements and drugs. These choices do not succeed in long-term weight control.  HOME CARE INSTRUCTIONS   Exercise and perform physical activity as directed by your caregiver. To increase physical activity, try the following:  Use stairs instead of elevators.  Park farther away from store entrances.  Garden, bike, or walk instead of  watching television or using the computer.  Eat healthy, low-calorie foods and drinks on a regular basis. Eat more fruits and vegetables. Use low-calorie cookbooks or take healthy cooking classes.  Limit fast food, sweets, and processed snack foods.  Eat smaller portions.  Keep a daily journal of everything you eat. There are many free websites to help you with this. It may be helpful to measure your foods so you can determine if you are eating the correct portion sizes.  Avoid drinking alcohol. Drink more water and drinks without calories.  Take vitamins and supplements only as recommended by your caregiver.  Weight-loss support groups, Nurse, mental health, counselors, and stress reduction education can also be very helpful. SEEK IMMEDIATE MEDICAL CARE IF:  You have chest pain or tightness.  You have trouble breathing or feel short of breath.  You have weakness or leg numbness.  You feel confused or have trouble talking.  You have sudden changes in your vision. MAKE SURE YOU:  Understand these instructions.  Will watch your condition.  Will get help right away if you are not doing well or get worse. Document Released: 11/16/2004 Document Revised: 04/09/2012 Document Reviewed: 11/15/2011 [facility] Patient Information 2013 [place]. Polycystic Ovarian Syndrome Polycystic ovarian syndrome is a condition with a number of problems. One problem is with the ovaries. The ovaries are organs located in the female pelvis, on each side of the uterus. Usually, during the menstrual cycle, an egg is released from 1 ovary every month. This is called ovulation. When the egg is fertilized, it goes into the womb (uterus), which allows for the growth of a baby. The egg travels from the ovary through the fallopian tube to the uterus. The ovaries also  make the hormones estrogen and progesterone. These hormones help the development of a woman's breasts, body shape, and body hair. They also  regulate the menstrual cycle and pregnancy. Sometimes, cysts form in the ovaries. A cyst is a fluid-filled sac. On the ovary, different types of cysts can form. The most common type of ovarian cyst is called a functional or ovulation cyst. It is normal, and often forms during the normal menstrual cycle. Each month, a woman's ovaries grow tiny cysts that hold the eggs. When an egg is fully grown, the sac breaks open. This releases the egg. Then, the sac which released the egg from the ovary dissolves. In one type of functional cyst, called a follicle cyst, the sac does not break open to release the egg. It may actually continue to grow. This type of cyst usually disappears within 1 to 3 months.  One type of cyst problem with the ovaries is called Polycystic Ovarian Syndrome (PCOS). In this condition, many follicle cysts form, but do not rupture and produce an egg. This health problem can affect the following:  Menstrual cycle.  Heart.  Obesity.  Cancer of the uterus.  Fertility.  Blood vessels.  Hair growth (face and body) or baldness.  Hormones.  Appearance.  High blood pressure.  Stroke.  Insulin production.  Inflammation of the liver.  Elevated blood cholesterol and triglycerides. CAUSES   No one knows the exact cause of PCOS.  Women with PCOS often have a mother or sister with PCOS. There is not yet enough proof to say this is inherited.  Many women with PCOS have a weight problem.  Researchers are looking at the relationship between PCOS and the body's ability to make insulin. Insulin is a hormone that regulates the change of sugar, starches, and other food into energy for the body's use, or for storage. Some women with PCOS make too much insulin. It is possible that the ovaries react by making too many female hormones, called androgens. This can lead to acne, excessive hair growth, weight gain, and ovulation problems.  Too much production of luteinizing hormone (LH) from  the pituitary gland in the brain stimulates the ovary to produce too much female hormone (androgen). SYMPTOMS   Infrequent or no menstrual periods, and/or irregular bleeding.  Inability to get pregnant (infertility), because of not ovulating.  Increased growth of hair on the face, chest, stomach, back, thumbs, thighs, or toes.  Acne, oily skin, or dandruff.  Pelvic pain.  Weight gain or obesity, usually carrying extra weight around the waist.  Type 2 diabetes (this is the diabetes that usually does not need insulin).  High cholesterol.  High blood pressure.  Female-pattern baldness or thinning hair.  Patches of thickened and dark brown or black skin on the neck, arms, breasts, or thighs.  Skin tags, or tiny excess flaps of skin, in the armpits or neck area.  Sleep apnea (excessive snoring and breathing stops at times while asleep).  Deepening of the voice.  Gestational diabetes when pregnant.  Increased risk of miscarriage with pregnancy. DIAGNOSIS  There is no single test to diagnose PCOS.   Your caregiver will:  Take a medical history.  Perform a pelvic exam.  Perform an ultrasound.  Check your female and female hormone levels.  Measure glucose or sugar levels in the blood.  Do other blood tests.  If you are producing too many female hormones, your caregiver will make sure it is from PCOS. At the physical exam, your caregiver will  want to evaluate the areas of increased hair growth. Try to allow natural hair growth for a few days before the visit.  During a pelvic exam, the ovaries may be enlarged or swollen by the increased number of small cysts. This can be seen more easily by vaginal ultrasound or screening, to examine the ovaries and lining of the uterus (endometrium) for cysts. The uterine lining may become thicker, if there has not been a regular period. TREATMENT  Because there is no cure for PCOS, it needs to be managed to prevent problems. Treatments are  based on your symptoms. Treatment is also based on whether you want to have a baby or whether you need contraception.  Treatment may include:  Progesterone hormone, to start a menstrual period.  Birth control pills, to make you have regular menstrual periods.  Medicines to make you ovulate, if you want to get pregnant.  Medicines to control your insulin.  Medicine to control your blood pressure.  Medicine and diet, to control your high cholesterol and triglycerides in your blood.  Surgery, making small holes in the ovary, to decrease the amount of female hormone production. This is done through a long, lighted tube (laparoscope), placed into the pelvis through a tiny incision in the lower abdomen. Your caregiver will go over some of the choices with you. WOMEN WITH PCOS HAVE THESE CHARACTERISTICS:  High levels of female hormones called androgens.  An irregular or no menstrual cycle.  May have many small cysts in their ovaries. PCOS is the most common hormonal reproductive problem in women of childbearing age. WHY DO WOMEN WITH PCOS HAVE TROUBLE WITH THEIR MENSTRUAL CYCLE? Each month, about 20 eggs start to mature in the ovaries. As one egg grows and matures, the follicle breaks open to release the egg, so it can travel through the fallopian tube for fertilization. When the single egg leaves the follicle, ovulation takes place. In women with PCOS, the ovary does not make all of the hormones it needs for any of the eggs to fully mature. They may start to grow and accumulate fluid, but no one egg becomes large enough. Instead, some may remain as cysts. Since no egg matures or is released, ovulation does not occur and the hormone progesterone is not made. Without progesterone, a woman's menstrual cycle is irregular or absent. Also, the cysts produce female hormones, which continue to prevent ovulation.  Document Released: 02/02/2005 Document Revised: 01/01/2012 Document Reviewed:  08/27/2009 [facility] Patient Information 2013 [place]. Dysfunctional Uterine Bleeding Normally, menstrual periods begin between ages 51 to 18 in young women. A normal menstrual cycle/period may begin every 23 days up to 35 days and lasts from 1 to 7 days. Around 12 to 14 days before your menstrual period starts, ovulation (ovary produces an egg) occurs. When counting the time between menstrual periods, count from the first day of bleeding of the previous period to the first day of bleeding of the next period. Dysfunctional (abnormal) uterine bleeding is bleeding that is different from a normal menstrual period. Your periods may come earlier or later than usual. They may be lighter, have blood clots or be heavier. You may have bleeding between periods, or you may skip one period or more. You may have bleeding after sexual intercourse, bleeding after menopause, or no menstrual period. CAUSES   Pregnancy (normal, miscarriage, tubal).  IUDs (intrauterine device, birth control).  Birth control pills.  Hormone treatment.  Menopause.  Infection of the cervix.  Blood clotting problems.  Infection of the inside lining of the uterus.  Endometriosis, inside lining of the uterus growing in the pelvis and other female organs.  Adhesions (scar tissue) inside the uterus.  Obesity or severe weight loss.  Uterine polyps inside the uterus.  Cancer of the vagina, cervix, or uterus.  Ovarian cysts or polycystic ovary syndrome.  Medical problems (diabetes, thyroid disease).  Uterine fibroids (noncancerous tumor).  Problems with your female hormones.  Endometrial hyperplasia, very thick lining and enlarged cells inside of the uterus.  Medicines that interfere with ovulation.  Radiation to the pelvis or abdomen.  Chemotherapy. DIAGNOSIS   Your doctor will discuss the history of your menstrual periods, medicines you are taking, changes in your weight, stress in your life, and any  medical problems you may have.  Your doctor will do a physical and pelvic examination.  Your doctor may want to perform certain tests to make a diagnosis, such as:  Pap test.  Blood tests.  Cultures for infection.  CT scan.  Ultrasound.  Hysteroscopy.  Laparoscopy.  MRI.  Hysterosalpingography.  D and C.  Endometrial biopsy. TREATMENT  Treatment will depend on the cause of the dysfunctional uterine bleeding (DUB). Treatment may include:  Observing your menstrual periods for a couple of months.  Prescribing medicines for medical problems, including:  Antibiotics.  Hormones.  Birth control pills.  Removing an IUD (intrauterine device, birth control).  Surgery:  D and C (scrape and remove tissue from inside the uterus).  Laparoscopy (examine inside the abdomen with a lighted tube).  Uterine ablation (destroy lining of the uterus with electrical current, laser, heat, or freezing).  Hysteroscopy (examine cervix and uterus with a lighted tube).  Hysterectomy (remove the uterus). HOME CARE INSTRUCTIONS   If medicines were prescribed, take exactly as directed. Do not change or switch medicines without consulting your caregiver.  Long term heavy bleeding may result in iron deficiency. Your caregiver may have prescribed iron pills. They help replace the iron that your body lost from heavy bleeding. Take exactly as directed.  Do not take aspirin or medicines that contain aspirin one week before or during your menstrual period. Aspirin may make the bleeding worse.  If you need to change your sanitary pad or tampon more than once every 2 hours, stay in bed with your feet elevated and a cold pack on your lower abdomen. Rest as much as possible, until the bleeding stops or slows down.  Eat well-balanced meals. Eat foods high in iron. Examples are:  Leafy green vegetables.  Whole-grain breads and cereals.  Eggs.  Meat.  Liver.  Do not try to lose weight  until the abnormal bleeding has stopped and your blood iron level is back to normal. Do not lift more than ten pounds or do strenuous activities when you are bleeding.  For a couple of months, make note on your calendar, marking the start and ending of your period, and the type of bleeding (light, medium, heavy, spotting, clots or missed periods). This is for your caregiver to better evaluate your problem. SEEK MEDICAL CARE IF:   You develop nausea (feeling sick to your stomach) and vomiting, dizziness, or diarrhea while you are taking your medicine.  You are getting lightheaded or weak.  You have any problems that may be related to the medicine you are taking.  You develop pain with your DUB.  You want to remove your IUD.  You want to stop or change your birth control pills or hormones.  You  have any type of abnormal bleeding mentioned above.  You are over 38 years old and have not had a menstrual period yet.  You are 36 years old and you are still having menstrual periods.  You have any of the symptoms mentioned above.  You develop a rash. SEEK IMMEDIATE MEDICAL CARE IF:   An oral temperature above 102 F (38.9 C) develops.  You develop chills.  You are changing your sanitary pad or tampon more than once an hour.  You develop abdominal pain.  You pass out or faint. Document Released: 10/06/2000 Document Revised: 01/01/2012 Document Reviewed: 09/07/2009 [facility] Patient Information 2013 [place].

## 2012-10-02 NOTE — Progress Notes (Signed)
Subjective:     Patient ID: Elizabeth Guerra, female   DOB: [birth year], 36 y.o.   MRN: [id number]  HPI  Pt c/o continued bleeding since her last visit.  Sometimes the bleeding is quite heavy.  Bleeding resolved briefly on the OCP's.  But, returned when she went to the q day pills.   Review of Systems     Objective:   Physical ExamBP 150/96  Pulse 83  Temp 98.6 F (37 C) (Oral)  Ht 5\' 5"  (1.651 m)  Wt 260 lb 1.6 oz (117.981 kg)  BMI 43.28 kg/m2  LMP 08/25/2012  Exam deferred  09/04/12 [facility] Endometrium, biopsy - POLYPOID PROLIFERATIVE PHASE ENDOMETRIUM WITH BREAKDOWN. - NEGATIVE FOR HYPERPLASIA OR MALIGNANCY. - BENIGN ENDOCERVICAL MUCOSA.     Assessment:     DUB- controlled on Femcon 2 po bid but, not once a day.  D/w pt treatment options including IUD, hyst and ablation.  Reviewed endo bx PCOS- pt interested in losing weight also wants to start Metformin given her hx and her family hx   obseity- d/w weight loss    Plan:     Completed Mirena grant paperwork F/u for Mirena Begin Metformin 500mg  q day for 1 week then 500mg  bid will increase to 1500mg  then 2000mg  on next visit Megace 40mg  q day until Mirena D/w pt weight loss   Suzanna Zahn L. Harraway-Smith, M.D., [place] June

## 2012-10-08 ENCOUNTER — Encounter: Payer: Self-pay | Admitting: Obstetrics and Gynecology

## 2012-10-28 ENCOUNTER — Ambulatory Visit: Payer: Self-pay | Admitting: Obstetrics and Gynecology

## 2013-04-30 ENCOUNTER — Telehealth: Payer: Self-pay | Admitting: *Deleted

## 2013-04-30 DIAGNOSIS — B9689 Other specified bacterial agents as the cause of diseases classified elsewhere: Secondary | ICD-10-CM

## 2013-04-30 DIAGNOSIS — N938 Other specified abnormal uterine and vaginal bleeding: Secondary | ICD-10-CM

## 2013-04-30 DIAGNOSIS — E282 Polycystic ovarian syndrome: Secondary | ICD-10-CM

## 2013-04-30 MED ORDER — METRONIDAZOLE 500 MG PO TABS: 500.0000 mg | ORAL_TABLET | Freq: Two times a day (BID) | ORAL | Status: DC

## 2013-04-30 MED ORDER — METFORMIN HCL 1000 MG PO TABS: 1000.0000 mg | ORAL_TABLET | Freq: Two times a day (BID) | ORAL | Status: DC

## 2013-04-30 MED ORDER — MEGESTROL ACETATE 40 MG PO TABS: 40.0000 mg | ORAL_TABLET | Freq: Every day | ORAL | Status: DC

## 2013-04-30 NOTE — Telephone Encounter (Signed)
Patient called and requested refills on her megace and metformin. Per last visit note she was supposed to return for Mirena, but didn't get approved and didn't come for followup appt. She is about to run out of the Megace and doesn't want to bleed. Can she get refills until she is able to make another appt? (last seen by [facility])

## 2013-04-30 NOTE — Telephone Encounter (Signed)
Patient left a message that she has BV and would like to know if she can get Flagyl called in? She has a thin fishy smelling discharge. Per protocol will call in Flagyl 500mg  PO BID for 7 days.

## 2013-04-30 NOTE — Telephone Encounter (Signed)
Medications refilled and sent to pharmacy on file.  Please make follow up appointment for her to come in and discuss further management.  Verita Schneiders, MD, [place] Attending [place], [place]

## 2013-04-30 NOTE — Telephone Encounter (Signed)
Pt informed. She will call at the end of month to schedule followup appt.

## 2013-05-06 ENCOUNTER — Other Ambulatory Visit: Payer: Self-pay | Admitting: Obstetrics & Gynecology

## 2013-05-06 DIAGNOSIS — N938 Other specified abnormal uterine and vaginal bleeding: Secondary | ICD-10-CM | POA: Insufficient documentation

## 2013-05-06 MED ORDER — MEGESTROL ACETATE 40 MG PO TABS: 40.0000 mg | ORAL_TABLET | Freq: Every day | ORAL | Status: DC

## 2013-08-08 ENCOUNTER — Encounter (HOSPITAL_COMMUNITY): Payer: Self-pay | Admitting: General Practice

## 2013-08-08 ENCOUNTER — Ambulatory Visit (INDEPENDENT_AMBULATORY_CARE_PROVIDER_SITE_OTHER): Payer: BC Managed Care – PPO | Admitting: Obstetrics & Gynecology

## 2013-08-08 ENCOUNTER — Inpatient Hospital Stay (HOSPITAL_COMMUNITY)
Admission: AD | Admit: 2013-08-08 | Discharge: 2013-08-08 | Disposition: A | Discharge status: Home / Self Care | Payer: BC Managed Care – PPO | Source: Ambulatory Visit | Attending: Emergency Medicine | Admitting: Emergency Medicine

## 2013-08-08 ENCOUNTER — Encounter: Payer: Self-pay | Admitting: Obstetrics & Gynecology

## 2013-08-08 VITALS — BP 209/130 | HR 94 | Temp 98.7°F | Ht 64.0 in | Wt 251.9 lb

## 2013-08-08 DIAGNOSIS — N938 Other specified abnormal uterine and vaginal bleeding: Secondary | ICD-10-CM

## 2013-08-08 DIAGNOSIS — I10 Essential (primary) hypertension: Secondary | ICD-10-CM | POA: Insufficient documentation

## 2013-08-08 DIAGNOSIS — N949 Unspecified condition associated with female genital organs and menstrual cycle: Secondary | ICD-10-CM

## 2013-08-08 LAB — BASIC METABOLIC PANEL
BUN: 7 mg/dL (ref 6–23)
CO2: 24 mEq/L (ref 19–32)
Calcium: 9.3 mg/dL (ref 8.4–10.5)
Chloride: 105 mEq/L (ref 96–112)
Creatinine, Ser: 1.06 mg/dL (ref 0.50–1.10)
GFR calc Af Amer: 78 mL/min — ABNORMAL LOW (ref >90)
GFR calc non Af Amer: 68 mL/min — ABNORMAL LOW (ref >90)
Glucose, Bld: 86 mg/dL (ref 70–99)
Potassium: 3.1 mEq/L — ABNORMAL LOW (ref 3.5–5.1)
Sodium: 144 mEq/L (ref 135–145)

## 2013-08-08 LAB — CBC
HCT: 38.5 % (ref 36.0–46.0)
Hemoglobin: 13 g/dL (ref 12.0–15.0)
MCH: 28.3 pg (ref 26.0–34.0)
MCHC: 33.8 g/dL (ref 30.0–36.0)
MCV: 83.7 fL (ref 78.0–100.0)
Platelets: 302 10*3/uL (ref 150–400)
RBC: 4.6 MIL/uL (ref 3.87–5.11)
RDW: 13.4 % (ref 11.5–15.5)
WBC: 6.6 10*3/uL (ref 4.0–10.5)

## 2013-08-08 LAB — POCT PREGNANCY, URINE: Preg Test, Ur: NEGATIVE

## 2013-08-08 MED ORDER — POTASSIUM CHLORIDE CRYS ER 20 MEQ PO TBCR
40.0000 meq | EXTENDED_RELEASE_TABLET | Freq: Once | ORAL | Status: AC
  Administered 2013-08-08: 40 meq via ORAL
  Filled 2013-08-08: qty 2

## 2013-08-08 MED ORDER — LISINOPRIL-HYDROCHLOROTHIAZIDE 20-25 MG PO TABS: 1.0000 | ORAL_TABLET | Freq: Every day | ORAL | Status: DC

## 2013-08-08 MED ORDER — SODIUM CHLORIDE 0.9 % IV SOLN
INTRAVENOUS | Status: DC
  Administered 2013-08-08: 14:00:00 via INTRAVENOUS

## 2013-08-08 MED ORDER — HYDROCHLOROTHIAZIDE 25 MG PO TABS
25.0000 mg | ORAL_TABLET | Freq: Every day | ORAL | Status: DC
  Administered 2013-08-08: 25 mg via ORAL
  Filled 2013-08-08: qty 1

## 2013-08-08 MED ORDER — LISINOPRIL 20 MG PO TABS
20.0000 mg | ORAL_TABLET | Freq: Every day | ORAL | Status: DC
  Administered 2013-08-08: 20 mg via ORAL
  Filled 2013-08-08: qty 1

## 2013-08-08 MED ORDER — LABETALOL HCL 5 MG/ML IV SOLN
20.0000 mg | Freq: Once | INTRAVENOUS | Status: DC
Start: 2013-08-08 — End: 2013-08-08

## 2013-08-08 NOTE — Progress Notes (Signed)
Subjective:     Patient ID: MIYOSHI KOIS, female   DOB: Apr 19, 1976, 37 y.o.   MRN: [id number]  HPI Pt with a h/o HTN who reports not taking her meds for over 2 days.  She reports a lot of personal stresses and a h/o mental illness. She denies HA or visual changes.  She reports that she had an appt scheduled with her primary care physician that she did not keep.     She reports that her bleeding is well controlled on the Megace.  She is undecided on the Mirena.  Had requests for hyst    Review of Systems     Objective:   Physical Exam BP 209/130  Pulse 94  Temp(Src) 98.7 F (37.1 C) (Oral)  Ht 5\' 4"  (1.626 m)  Wt 251 lb 14.4 oz (114.261 kg)  BMI 43.22 kg/m2  LMP 11/03/2012      Assessment:     Malignant HTN- pt to be taken to the MAU for eval.  D/w pt the increased risk of CVA for uncontrolled HTN and the importance of taking her meds DAILY and following up with her medical doc.   DUB- improved on Megace will continue for now.  Pt still to consider Mirena vs Novasure .  D/w pt that a hyst is not an option for her at present with uncontrolled HTN.  Also d/w her the increased risk due to her size.  We will obtain a repeat sono to check the size of her uterus to see if she is a candidate for Novasure.  Pt to keep Megace until that time.     Plan:     To the MAU by nurse now sono F/u in 3 months or sooner prn Continue Megace

## 2013-08-08 NOTE — MAU Note (Signed)
Pt transferred to [facility] via stretcher with [facility]

## 2013-08-08 NOTE — MAU Note (Signed)
Warm packs applied to to arm and hand to aid in finding a vein to start iv

## 2013-08-08 NOTE — MAU Provider Note (Signed)
History     CSN: [id number]  Arrival date and time: 08/08/13 1139   First Provider Initiated Contact with Patient 08/08/13 1201      Chief Complaint  Patient presents with  . Hypertension   HPI  Ms. Elizabeth Guerra is a 37 y.o. female G2P2002, non-pregnant who was sent over from the clinic due to elevated BP readings in the clinic. The patient has a history of malignant HTN and currently takes lisinopril-HCTZ. Pt states she had a terrible day at work yesterday and came home and went directly to bed without taking her BP medication. She recalls taking the medication Weds; none yesterday and none today. She is currently experiencing a HA that she rates 5/10; declines the need for pain medication at this time.   OB History   Grav Para Term Preterm Abortions TAB SAB Ect Mult Living   2 2 2       2       Past Medical History  Diagnosis Date  . Anxiety   . Depression   . Hypertension   . Pregnancy induced hypertension 2002    Past Surgical History  Procedure Laterality Date  . Tubal ligation      HULKA CLIPS  . Cesarean section      X2, 2000 AND 2002    Family History  Problem Relation Age of Onset  . Hypertension Mother   . Hypertension Father   . Cancer Father     PROSTATE  . Heart failure Paternal Aunt   . Hypertension Paternal Aunt   . Hypertension Paternal Uncle   . Cancer Maternal Grandmother   . Diabetes Maternal Grandmother     LUNG  . Heart failure Paternal Grandmother   . Hypertension Paternal Grandmother   . Heart failure Paternal Grandfather   . Hypertension Paternal Grandfather     History  Substance Use Topics  . Smoking status: Never Smoker   . Smokeless tobacco: Never Used  . Alcohol Use: Yes     Comment: OCCASIONALLY    Allergies:  Allergies  Allergen Reactions  . Amoxicillin Nausea And Vomiting    HEADACHES  . Latex     Prescriptions prior to admission  Medication Sig Dispense Refill  . ALPRAZolam (XANAX) 1 MG tablet Take 1 mg by  mouth at bedtime as needed for sleep.      Marland Kitchen buPROPion (WELLBUTRIN SR) 150 MG 12 hr tablet Take 150 mg by mouth daily.      Marland Kitchen lisinopril-hydrochlorothiazide (PRINZIDE,ZESTORETIC) 20-25 MG per tablet Take 1 tablet by mouth daily.      . megestrol (MEGACE) 40 MG tablet Take 1 tablet (40 mg total) by mouth daily. Can increase to twice a day for heavy bleeding  60 tablet  3  . metFORMIN (GLUCOPHAGE) 1000 MG tablet Take 1 tablet (1,000 mg total) by mouth 2 (two) times daily with a meal.  60 tablet  3  . metroNIDAZOLE (FLAGYL) 500 MG tablet Take 1 tablet (500 mg total) by mouth 2 (two) times daily.  14 tablet  0  . Naproxen Sodium (ALEVE) 220 MG CAPS Take 2 capsules by mouth as needed.      . norgestimate-ethinyl estradiol (ORTHO-CYCLEN,SPRINTEC,PREVIFEM) 0.25-35 MG-MCG tablet Take 1 tablet by mouth daily. For first pack ONLY: Take 2 po bid for 4 days then  Take 1 po bid for 4 days then 1 po q day  1 Package  11  . sertraline (ZOLOFT) 100 MG tablet Take 100 mg by mouth daily.      Marland Kitchen  traZODone (DESYREL) 50 MG tablet Take 100 mg by mouth at bedtime.       Results for orders placed during the hospital encounter of 08/08/13 (from the past 24 hour(s))  POCT PREGNANCY, URINE     Status: None   Collection Time    08/08/13  1:05 PM      Result Value Range   Preg Test, Ur NEGATIVE  NEGATIVE    Review of Systems  Constitutional: Negative for fever and chills.  Eyes: Negative for blurred vision, double vision and photophobia.  Cardiovascular: Negative for chest pain and palpitations.  Gastrointestinal: Negative for nausea, vomiting and abdominal pain.  Neurological: Positive for headaches. Negative for dizziness.   Physical Exam   Blood pressure 213/112, pulse 105, resp. rate 18, height 5\' 3"  (1.6 m), weight 252 lb 12.8 oz (114.669 kg), last menstrual period 11/03/2012.  Physical Exam  Constitutional: She is oriented to person, place, and time. She appears well-developed and well-nourished. No  distress.  HENT:  Head: Normocephalic.  Eyes: Pupils are equal, round, and reactive to light.  Neck: Normal range of motion. Neck supple.  Cardiovascular: Normal rate.   Respiratory: Effort normal.  Neurological: She is alert and oriented to person, place, and time. She has normal strength. She is not disoriented. GCS eye subscore is 4. GCS verbal subscore is 5. GCS motor subscore is 6.  Skin: Skin is warm and dry. She is not diaphoretic.  Psychiatric: Her behavior is normal.    MAU Course  Procedures None  MDM Pt to transfer to [facility] under the care of Dr. Wilson Singer; accepting physician.  I discussed with Dr. Wilson Singer the need for medication administration prior to transport and the plan was decided to wait until patient was evaluated there.  IV attempted in MAU; pt very anxious about IV due to a history of poor access, has had IV's placed in feet and neck in the past.  1245: [facility] at bedside.   Assessment and Plan  A: Hypertensive crisis  P: Transfer to [facility] ER per Dr. Wilson Singer for further evaluation.   Levis Nazir IRENE FNP-C 08/08/2013, 1:11 PM

## 2013-08-08 NOTE — ED Notes (Signed)
Pt from [facility] via [facility] for HTN. Denies any complaints. Was going there because she was having some abnormal vaginal bleeding. BP 203/134 and states she takes her BP med at night which is Lisinopril/HCTZ. Has a 20g to the Right wrist.

## 2013-08-08 NOTE — MAU Note (Signed)
REport given to charge nurse at [facility] called to request transport.

## 2013-08-08 NOTE — ED Provider Notes (Signed)
CSN: [id number]     Arrival date & time 08/08/13  1139 History   First MD Initiated Contact with Patient 08/08/13 1324     Chief Complaint  Patient presents with  . Hypertension   (Consider location/radiation/quality/duration/timing/severity/associated sxs/prior Treatment) HPI Patient presents to the ED from [facility] with hypertension since this morning. She reports that she missed her regular dose of lisinopril last night. She was seen at [facility] hospital this morning for abnormal uterine bleeding and was found to have hypertension. In the ED, her BP was found to be 213/112. She reports that she had a panic attack after being told how high her blood pressure was this morning. She has a history of anxiety which is controlled with medication. She reports that she has been under very high stress for 2 weeks, which she thinks is causing her hypertension. She denies headache, chest pain, abdominal pain, dizziness, lightheadedness, or vision changes back pain, nausea, vomiting, weakness, numbness, dysuria, fever, or syncope  Past Medical History  Diagnosis Date  . Anxiety   . Depression   . Hypertension   . Pregnancy induced hypertension 2002   Past Surgical History  Procedure Laterality Date  . Tubal ligation      HULKA CLIPS  . Cesarean section      X2, 2000 AND 2002   Family History  Problem Relation Age of Onset  . Hypertension Mother   . Hypertension Father   . Cancer Father     PROSTATE  . Heart failure Paternal Aunt   . Hypertension Paternal Aunt   . Hypertension Paternal Uncle   . Cancer Maternal Grandmother   . Diabetes Maternal Grandmother     LUNG  . Heart failure Paternal Grandmother   . Hypertension Paternal Grandmother   . Heart failure Paternal Grandfather   . Hypertension Paternal Grandfather    History  Substance Use Topics  . Smoking status: Never Smoker   . Smokeless tobacco: Never Used  . Alcohol Use: Yes     Comment: OCCASIONALLY   OB History   Grav Para  Term Preterm Abortions TAB SAB Ect Mult Living   2 2 2       2      Review of Systems All other systems negative except as documented in the HPI. All pertinent positives and negatives as reviewed in the HPI. Allergies  Adhesive; Amoxicillin; and Latex  Home Medications   Current Outpatient Rx  Name  Route  Sig  Dispense  Refill  . ALPRAZolam (XANAX) 1 MG tablet   Oral   Take 1 mg by mouth at bedtime as needed for sleep.         . megestrol (MEGACE) 40 MG tablet   Oral   Take 1 tablet (40 mg total) by mouth daily. Can increase to twice a day for heavy bleeding   60 tablet   3   . Melatonin 5 MG TABS   Oral   Take 1 tablet by mouth at bedtime.         . Multiple Vitamin (MULTIVITAMIN WITH MINERALS) TABS tablet   Oral   Take 1 tablet by mouth daily.         . Naproxen Sodium (ALEVE) 220 MG CAPS   Oral   Take 2 capsules by mouth daily as needed.          Marland Kitchen OVER THE COUNTER MEDICATION   Oral   Take 2 capsules by mouth at bedtime. Over the counter, Femdophlis, probiotic supplement         .  sertraline (ZOLOFT) 100 MG tablet   Oral   Take 100 mg by mouth daily.         Marland Kitchen buPROPion (WELLBUTRIN SR) 150 MG 12 hr tablet   Oral   Take 150 mg by mouth daily.         Marland Kitchen lisinopril-hydrochlorothiazide (PRINZIDE,ZESTORETIC) 20-25 MG per tablet   Oral   Take 1 tablet by mouth daily.         . metFORMIN (GLUCOPHAGE) 1000 MG tablet   Oral   Take 1 tablet (1,000 mg total) by mouth 2 (two) times daily with a meal.   60 tablet   3    BP 164/104  Pulse 86  Resp 20  Ht 5\' 3"  (1.6 m)  Wt 252 lb 12.8 oz (114.669 kg)  BMI 44.79 kg/m2  SpO2 99%  LMP 11/03/2012 Physical Exam  Nursing note and vitals reviewed. Constitutional: She is oriented to person, place, and time. She appears well-developed and well-nourished. No distress.  HENT:  Head: Normocephalic and atraumatic.  Mouth/Throat: Oropharynx is clear and moist.  Eyes: Pupils are equal, round, and reactive  to light.  Neck: Normal range of motion. Neck supple.  Cardiovascular: Normal rate, regular rhythm and normal heart sounds.  Exam reveals no gallop and no friction rub.   No murmur heard. Pulmonary/Chest: Effort normal and breath sounds normal. No respiratory distress. She has no wheezes. She has no rales.  Abdominal: Soft. Bowel sounds are normal. She exhibits no distension. There is no tenderness.  Musculoskeletal: She exhibits no edema.  Neurological: She is alert and oriented to person, place, and time. She exhibits normal muscle tone. Coordination normal.  Skin: Skin is warm and dry. She is not diaphoretic.    ED Course  Procedures (including critical care time) Patient was sent to the emergency department for elevated blood pressure.  The patient, states she's been under a significant amount of stress.  The patient, states, that she has been dealing with multiple life stressors and abnormal vaginal bleeding.  Patient did not take her blood pressure medicine, yesterday or today.  The patient has no current symptoms that would indicate hypertensive crisis or emergency.  Patient's blood pressure is elevated.  She'll be given her regular medicine, here in the emergency department and asked to follow up with her primary care Dr. also advised the patient that she does need to stick to her medication regimen.     Brent General, PA-C 08/09/13 [phone]

## 2013-08-08 NOTE — MAU Note (Signed)
Patient was sent from the [facility] for elevated blood pressure. Patient has malignant hypertension and needs to be transported to [facility] ED per Dr. Ihor Dow.

## 2013-08-08 NOTE — Patient Instructions (Signed)
Menorrhagia Dysfunctional uterine bleeding is different from a normal menstrual period. When periods are heavy or there is more bleeding than is usual for you, it is called menorrhagia. It may be caused by hormonal imbalance, or physical, metabolic, or other problems. Examination is necessary in order that your caregiver may treat treatable causes. If this is a continuing problem, a D&C may be needed. That means that the cervix (the opening of the uterus or womb) is dilated (stretched larger) and the lining of the uterus is scraped out. The tissue scraped out is then examined under a microscope by a specialist (pathologist) to make sure there is nothing of concern that needs further or more extensive treatment. HOME CARE INSTRUCTIONS   If medications were prescribed, take exactly as directed. Do not change or switch medications without consulting your caregiver.  Long term heavy bleeding may result in iron deficiency. Your caregiver may have prescribed iron pills. They help replace the iron your body lost from heavy bleeding. Take exactly as directed. Iron may cause constipation. If this becomes a problem, increase the bran, fruits, and roughage in your diet.  Do not take aspirin or medicines that contain aspirin one week before or during your menstrual period. Aspirin may make the bleeding worse.  If you need to change your sanitary pad or tampon more than once every 2 hours, stay in bed and rest as much as possible until the bleeding stops.  Eat well-balanced meals. Eat foods high in iron. Examples are leafy green vegetables, meat, liver, eggs, and whole grain breads and cereals. Do not try to lose weight until the abnormal bleeding has stopped and your blood iron level is back to normal. SEEK MEDICAL CARE IF:   You need to change your sanitary pad or tampon more than once an hour.  You develop nausea (feeling sick to your stomach) and vomiting, dizziness, or diarrhea while you are taking your  medicine.  You have any problems that may be related to the medicine you are taking. SEEK IMMEDIATE MEDICAL CARE IF:   You have a fever.  You develop chills.  You develop severe bleeding or start to pass blood clots.  You feel dizzy or faint. MAKE SURE YOU:   Understand these instructions.  Will watch your condition.  Will get help right away if you are not doing well or get worse. Document Released: 10/09/2005 Document Revised: 01/01/2012 Document Reviewed: 05/29/2008 [facility] Patient Information 2014 [place].

## 2013-08-11 NOTE — MAU Provider Note (Signed)
Attestation of Attending Supervision of Advanced Practitioner (CNM/NP): Evaluation and management procedures were performed by the Advanced Practitioner under my supervision and collaboration.  I have reviewed the Advanced Practitioner's note and chart, and I agree with the management and plan.  Aurther Harlin 08/11/2013 2:18 PM

## 2013-08-13 ENCOUNTER — Ambulatory Visit (HOSPITAL_COMMUNITY)
Admission: RE | Admit: 2013-08-13 | Discharge: 2013-08-13 | Disposition: A | Discharge status: Home / Self Care | Payer: BC Managed Care – PPO | Source: Ambulatory Visit | Attending: Obstetrics & Gynecology | Admitting: Obstetrics & Gynecology

## 2013-08-13 DIAGNOSIS — N83209 Unspecified ovarian cyst, unspecified side: Secondary | ICD-10-CM | POA: Insufficient documentation

## 2013-08-13 DIAGNOSIS — D252 Subserosal leiomyoma of uterus: Secondary | ICD-10-CM | POA: Insufficient documentation

## 2013-08-13 DIAGNOSIS — N949 Unspecified condition associated with female genital organs and menstrual cycle: Secondary | ICD-10-CM | POA: Insufficient documentation

## 2013-08-13 DIAGNOSIS — N938 Other specified abnormal uterine and vaginal bleeding: Secondary | ICD-10-CM | POA: Insufficient documentation

## 2013-08-13 NOTE — ED Provider Notes (Signed)
Medical screening examination/treatment/procedure(s) were performed by non-physician practitioner and as supervising physician I was immediately available for consultation/collaboration.  EKG Interpretation   None        Virgel Manifold, MD 08/13/13 2333

## 2013-08-28 ENCOUNTER — Other Ambulatory Visit: Payer: Self-pay

## 2013-12-30 ENCOUNTER — Telehealth: Payer: Self-pay | Admitting: *Deleted

## 2013-12-30 DIAGNOSIS — N760 Acute vaginitis: Principal | ICD-10-CM

## 2013-12-30 DIAGNOSIS — B9689 Other specified bacterial agents as the cause of diseases classified elsewhere: Secondary | ICD-10-CM

## 2013-12-30 MED ORDER — METRONIDAZOLE 500 MG PO TABS: 500.0000 mg | ORAL_TABLET | Freq: Two times a day (BID) | ORAL | Status: DC

## 2013-12-30 NOTE — Telephone Encounter (Signed)
Opened in error, wrong patient. 

## 2013-12-30 NOTE — Telephone Encounter (Signed)
Pt called nurse line requesting medication for BV> Medication ordered, Pt verbalizes understanding.

## 2013-12-30 NOTE — Telephone Encounter (Signed)
Pt called nurse line requesting prescription for BV.

## 2014-03-09 ENCOUNTER — Other Ambulatory Visit: Payer: Self-pay | Admitting: Obstetrics & Gynecology

## 2014-08-24 ENCOUNTER — Encounter (HOSPITAL_COMMUNITY): Payer: Self-pay | Admitting: General Practice

## 2014-10-02 ENCOUNTER — Telehealth: Payer: Self-pay

## 2014-10-02 NOTE — Telephone Encounter (Signed)
Pt called requesting a refill on her megace.  She states that she has not been in our office in while but she has been bleeding extremely heavy for three weeks now.  "Do I need to make an appt or can I just get a refill?" Called pt and left message that we are returning her call that our office is currently closed and that she will need to make an appt for evaluation but if she continues to have concerns I encourage her to go [facility].

## 2014-10-06 NOTE — Telephone Encounter (Signed)
Called pt and left messge that this is our second attempt if you continue to have questions, comments, or concerns to please give our office a call.

## 2015-01-30 IMAGING — US US PELVIS COMPLETE
1 series · 13 of 25 positions shown · non-contrast
Comparison: 03/10/2011

CLINICAL DATA: Dysfunctional uterine bleeding. LMP 11/06/2012.

EXAM:
TRANSABDOMINAL AND TRANSVAGINAL ULTRASOUND OF PELVIS
TECHNIQUE: Both transabdominal and transvaginal ultrasound examinations of the
pelvis were performed. Transabdominal technique was performed for
global imaging of the pelvis including uterus, ovaries, adnexal
regions, and pelvic cul-de-sac. It was necessary to proceed with
endovaginal exam following the transabdominal exam to visualize the
endometrium and ovaries.

[Series 1: us pelvis complete · 61 acquisitions, 13 frames shown]
[im 1/61]
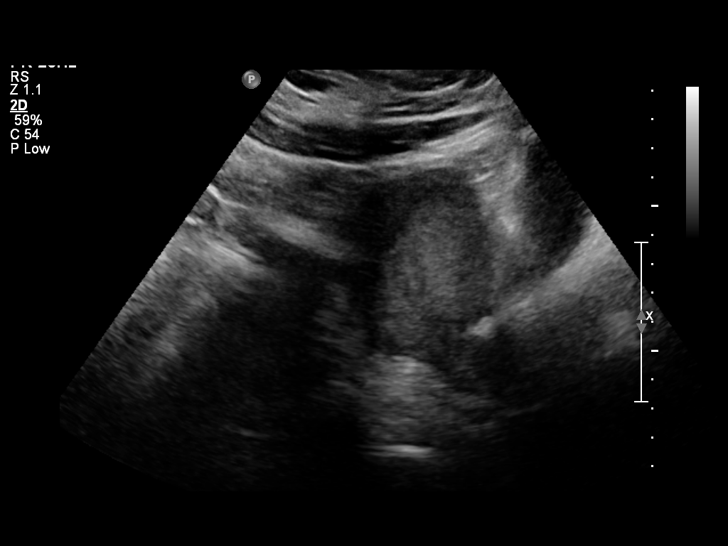
[im 6/61]
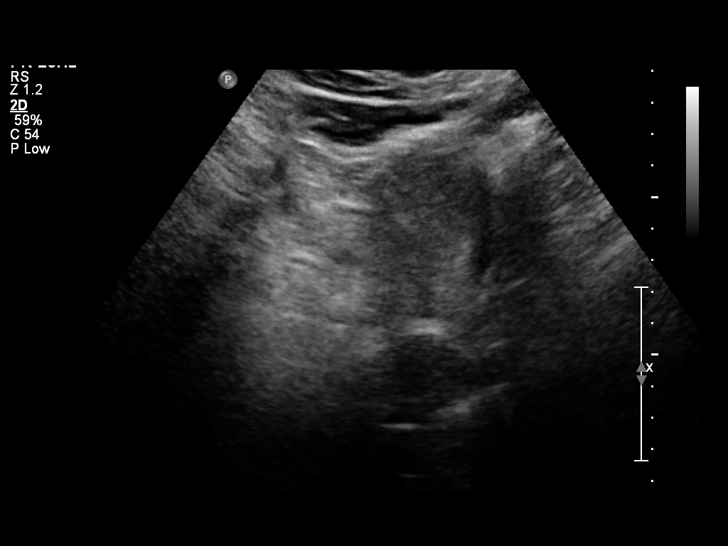
[im 11/61]
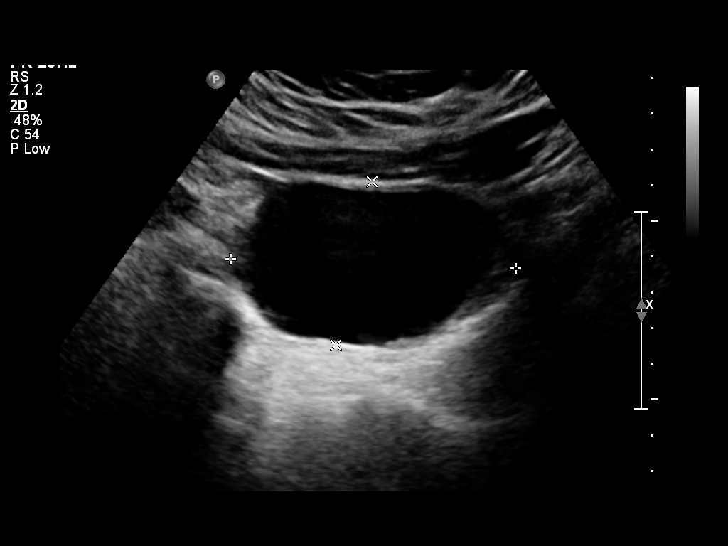
[im 16/61]
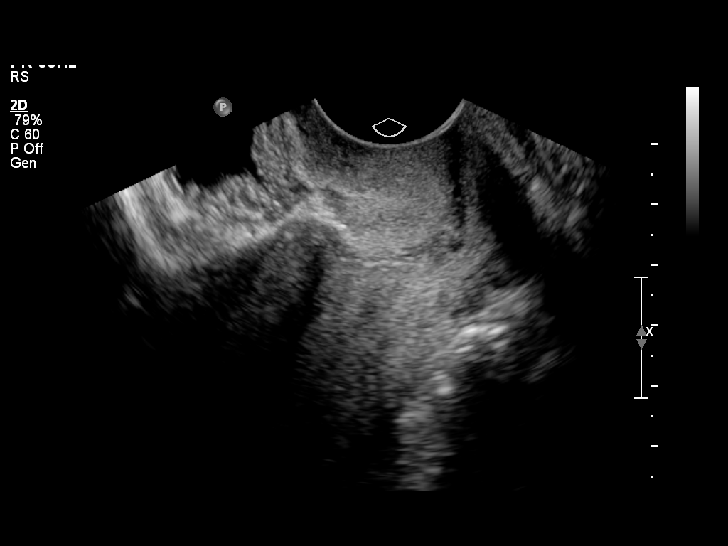
[im 21/61]
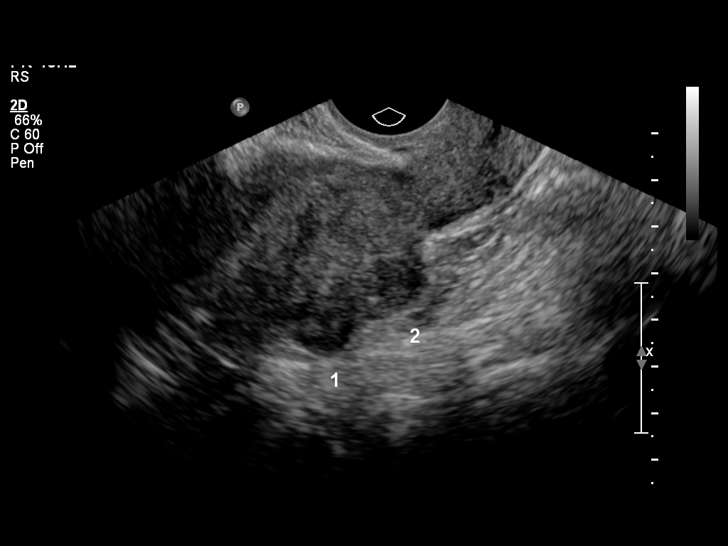
[im 26/61]
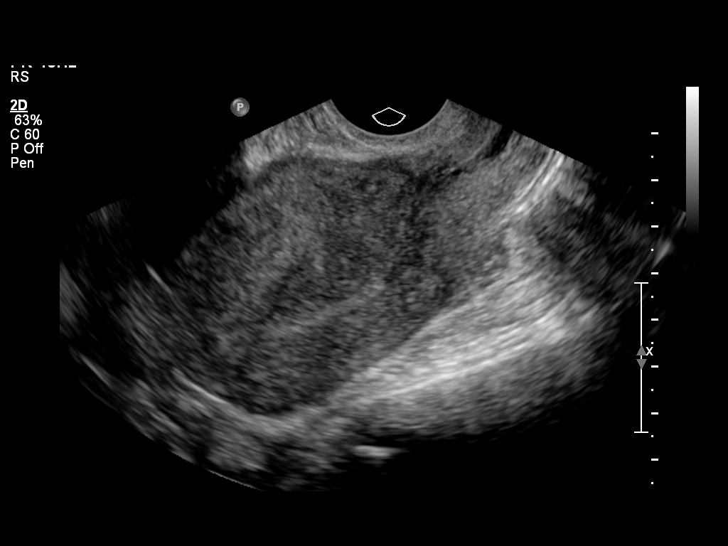
[im 31/61]
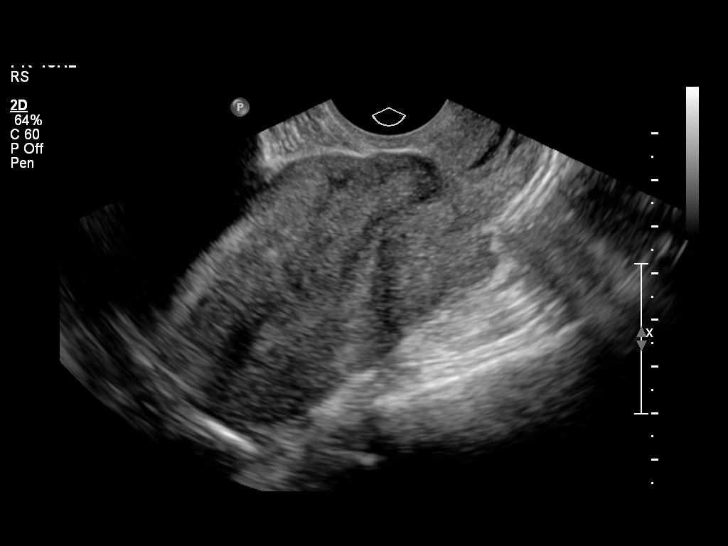
[im 36/61]
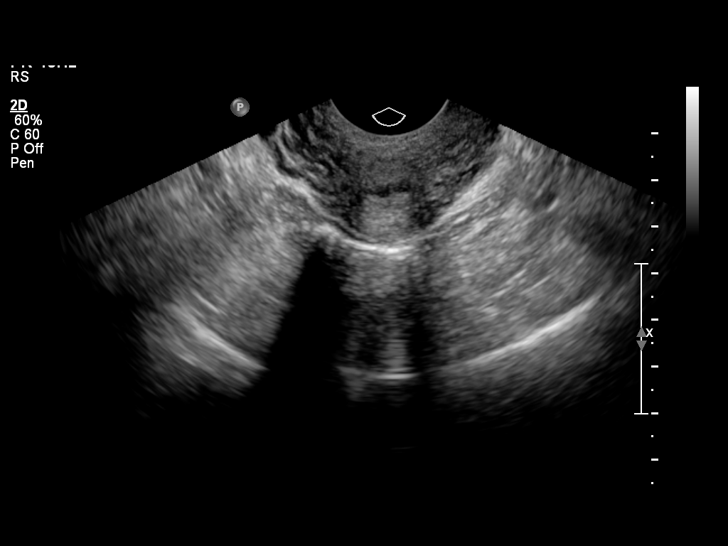
[im 41/61]
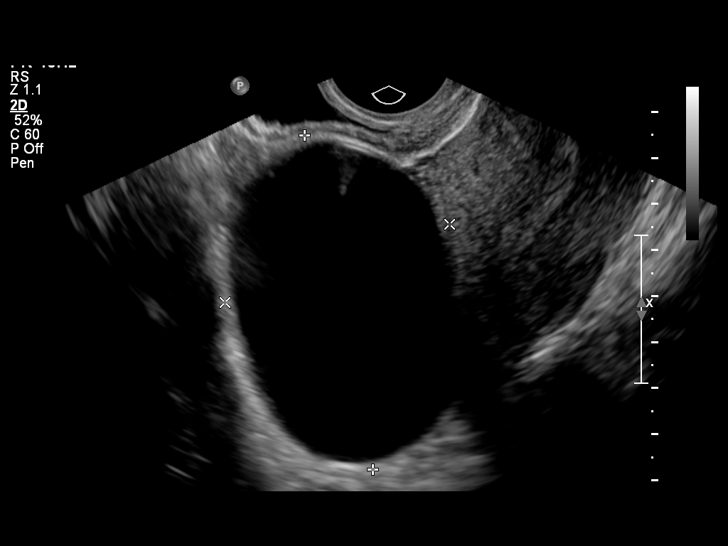
[im 46/61]
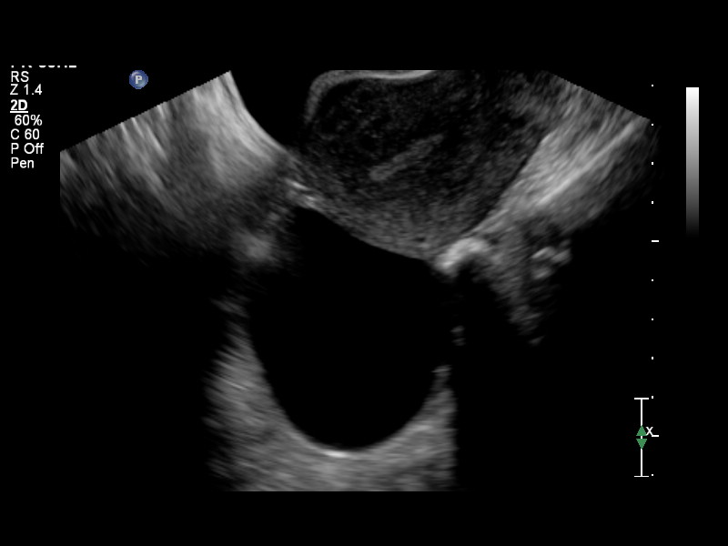
[im 51/61]
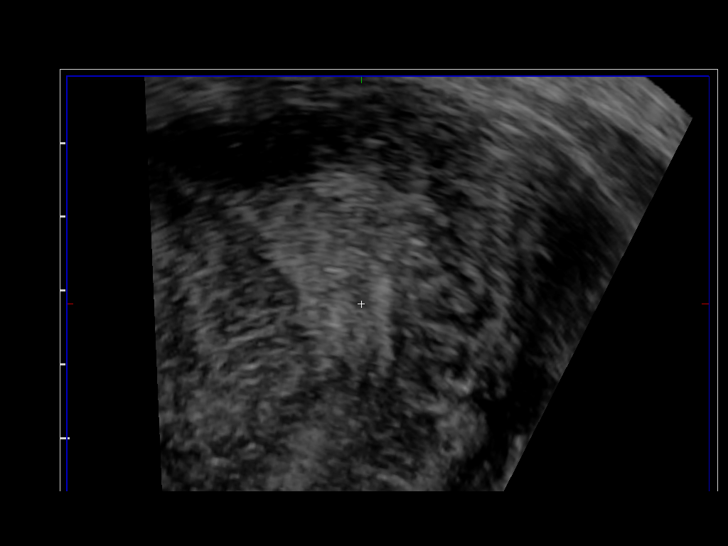
[im 56/61]
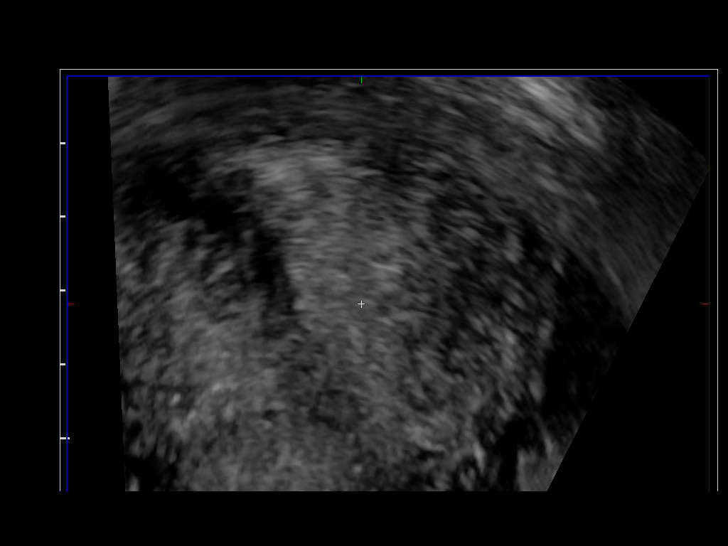
[im 61/61]
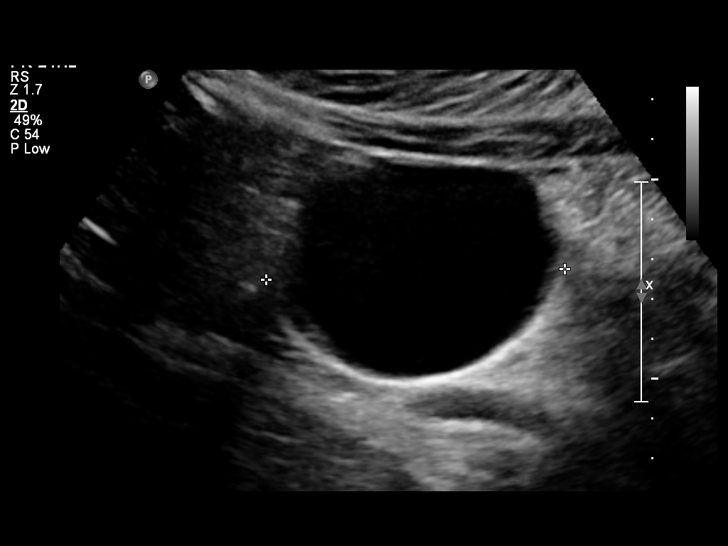

[13 of 25 positions shown; findings below may reference images not displayed]

FINDINGS: Uterus

Measurements: 10.5 x 4.9 x 7.1 cm. Two small subserosal fibroids are
seen in the left posterior corpus which measure 1.7 cm and 1.4 cm.

Endometrium

Thickness: 6 mm.  No focal abnormality visualized.

Right ovary

Measurements: 2.7 x 2.1 x 2.0 cm. Normal appearance/no adnexal mass.

Left ovary

Measurements: 7.9 x 5.4 x 7.5 cm. A simple cyst is again seen
measuring 6.7 x 4.8 x 6.7 cm. This is stable when compared to
previous study and has benign characteristics.

Other findings

No free fluid.
IMPRESSION: Two tiny posterior subserosal fibroids.

Endometrial thickness measures 6 mm. If bleeding remains
unresponsive to hormonal or medical therapy, sonohysterogram should
be considered for focal lesion work-up. (Ref: Radiological
Reasoning: Algorithmic Workup of Abnormal Vaginal Bleeding with
Endovaginal Sonography and Sonohysterography. AJR 6229; 191:S68-73).

Stable 6 cm benign appearing left adnexal cyst. Continued yearly
followup by ultrasound is recommended. This recommendation follows
the consensus statement: Management of Asymptomatic Ovarian and
Other Adnexal Cysts Imaged at US: Society of Radiologists in
Ultrasound Consensus Conference Statement. Radiology 7919;

## 2015-06-09 ENCOUNTER — Ambulatory Visit: Payer: Self-pay | Admitting: Obstetrics & Gynecology

## 2015-11-26 ENCOUNTER — Other Ambulatory Visit: Payer: Self-pay | Admitting: Nephrology

## 2015-11-26 DIAGNOSIS — N1832 Chronic kidney disease, stage 3b: Secondary | ICD-10-CM

## 2015-11-26 DIAGNOSIS — N183 Chronic kidney disease, stage 3 (moderate): Principal | ICD-10-CM

## 2015-12-02 ENCOUNTER — Ambulatory Visit
Admission: RE | Admit: 2015-12-02 | Discharge: 2015-12-02 | Disposition: A | Discharge status: Home / Self Care | Payer: BLUE CROSS/BLUE SHIELD | Source: Ambulatory Visit | Attending: Nephrology | Admitting: Nephrology

## 2015-12-02 DIAGNOSIS — N183 Chronic kidney disease, stage 3 (moderate): Principal | ICD-10-CM

## 2015-12-02 DIAGNOSIS — N1832 Chronic kidney disease, stage 3b: Secondary | ICD-10-CM

## 2016-02-07 ENCOUNTER — Ambulatory Visit (INDEPENDENT_AMBULATORY_CARE_PROVIDER_SITE_OTHER): Payer: BLUE CROSS/BLUE SHIELD | Admitting: Obstetrics and Gynecology

## 2016-02-07 ENCOUNTER — Encounter: Payer: Self-pay | Admitting: Obstetrics and Gynecology

## 2016-02-07 VITALS — BP 156/100 | HR 85 | Wt 278.9 lb

## 2016-02-07 DIAGNOSIS — N898 Other specified noninflammatory disorders of vagina: Secondary | ICD-10-CM

## 2016-02-07 NOTE — Progress Notes (Signed)
40 yo G2P2 presenting today for the evaluation of vaginitis. Patient reports the onset of a vaginal discharge a week ago. The discharge is non-pruritic and at times has a bad smell. She also has been taking Megace for the medical management of DUB. This has been working very well for her and she had several questions on the future management of her DUB.  Past Medical History  Diagnosis Date  . Anxiety   . Depression   . Hypertension   . Pregnancy induced hypertension 2002   Past Surgical History  Procedure Laterality Date  . Tubal ligation      HULKA CLIPS  . Cesarean section      X2, 2000 AND 2002   Family History  Problem Relation Age of Onset  . Hypertension Mother   . Hypertension Father   . Cancer Father     PROSTATE  . Heart failure Paternal Aunt   . Hypertension Paternal Aunt   . Hypertension Paternal Uncle   . Cancer Maternal Grandmother   . Diabetes Maternal Grandmother     LUNG  . Heart failure Paternal Grandmother   . Hypertension Paternal Grandmother   . Heart failure Paternal Grandfather   . Hypertension Paternal Grandfather    Social History  Substance Use Topics  . Smoking status: Never Smoker   . Smokeless tobacco: Never Used  . Alcohol Use: Yes     Comment: OCCASIONALLY   ROS See pertinent in HPI  Blood pressure 156/100, pulse 85, weight 278 lb 14.4 oz (126.508 kg), last menstrual period 01/09/2016. GENERAL: Well-developed, well-nourished female in no acute distress.  ABDOMEN: Soft, nontender, nondistended. Obese PELVIC: Normal external female genitalia. Vagina is pink and rugated.  Normal discharge. Normal appearing cervix. Uterus is normal in size. No adnexal mass or tenderness. EXTREMITIES: No cyanosis, clubbing, or edema, 2+ distal pulses.  A/P 40 yo with vaginitis - wet prep collected - Medical management of her DUB with Megace and levonorgestrel IUD were discussed with the patient. I also discussed surgical management with endometrial  ablation. Patient decided to think about it further and will continue Megace for now - patient will be contacted with any abnormal results - She reports normal pap smear with PCP 2 weeks ago - RTC prn

## 2016-02-08 ENCOUNTER — Other Ambulatory Visit: Payer: Self-pay

## 2016-02-08 ENCOUNTER — Other Ambulatory Visit: Payer: Self-pay | Admitting: Obstetrics and Gynecology

## 2016-02-08 ENCOUNTER — Telehealth: Payer: Self-pay

## 2016-02-08 DIAGNOSIS — N938 Other specified abnormal uterine and vaginal bleeding: Secondary | ICD-10-CM

## 2016-02-08 LAB — POCT URINALYSIS DIP (DEVICE)
Bilirubin Urine: NEGATIVE
Glucose, UA: NEGATIVE mg/dL
Hgb urine dipstick: NEGATIVE
Ketones, ur: NEGATIVE mg/dL
Leukocytes, UA: NEGATIVE
Nitrite: NEGATIVE
Protein, ur: NEGATIVE mg/dL
Specific Gravity, Urine: 1.025 (ref 1.005–1.030)
Urobilinogen, UA: 0.2 mg/dL (ref 0.0–1.0)
pH: 5.5 (ref 5.0–8.0)

## 2016-02-08 LAB — WET PREP, GENITAL
Trich, Wet Prep: NONE SEEN
WBC, Wet Prep HPF POC: NONE SEEN
Yeast Wet Prep HPF POC: NONE SEEN

## 2016-02-08 MED ORDER — MEGESTROL ACETATE 40 MG PO TABS: 40.0000 mg | ORAL_TABLET | Freq: Every day | ORAL | Status: DC

## 2016-02-08 MED ORDER — METRONIDAZOLE 500 MG PO TABS: 500.0000 mg | ORAL_TABLET | Freq: Two times a day (BID) | ORAL | Status: DC

## 2016-02-08 NOTE — Telephone Encounter (Signed)
Pt has been informed of BV and was advised to pick up medicine at her pharmacy.

## 2016-04-11 ENCOUNTER — Telehealth: Payer: Self-pay | Admitting: *Deleted

## 2016-04-11 NOTE — Telephone Encounter (Signed)
Pt left message stating that her Megace has stopped working. She is having extensively heavy bleeding and excruciating pain. Tylenol does not help the pain and her nephrologist does not want her to take ANSAIDS. She requests call back ASAP.

## 2016-04-18 NOTE — Telephone Encounter (Signed)
Spoke with patient who states she continues to have heavy bleeding at times. Patient stated she stop taking the megace because she felt like it was making it worse. I have advised patient to restart taking megace until she determines if she would like to have an IUD placed. Pt verbalizes understanding at this time and will restart megace.

## 2017-05-20 IMAGING — US US RENAL
1 series · 14 of 25 positions shown · non-contrast
Comparison: None.

CLINICAL DATA: Stage III chronic renal disease

EXAM:
RENAL / URINARY TRACT ULTRASOUND COMPLETE

[Series 1: us renal · 0.30mm/px · 14 of 36 slices shown]
[im 1/36]
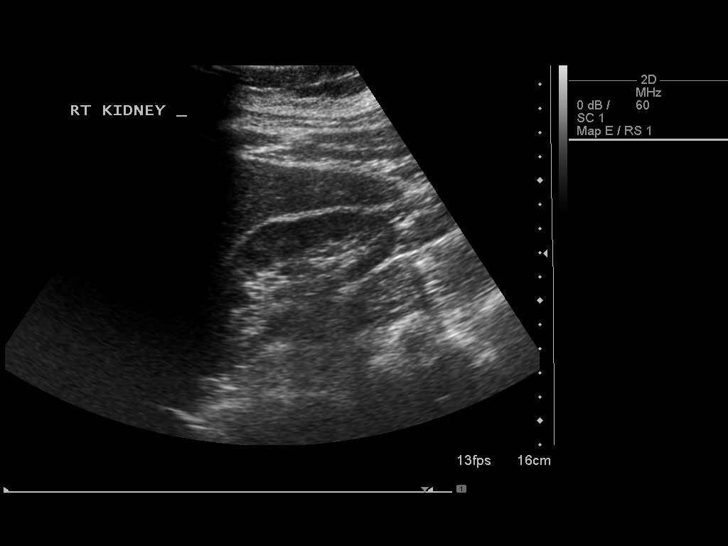
[im 3/36]
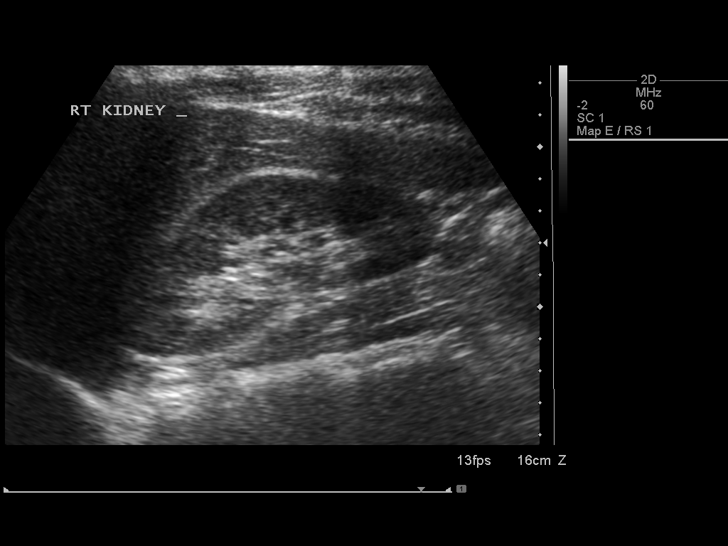
[im 6/36]
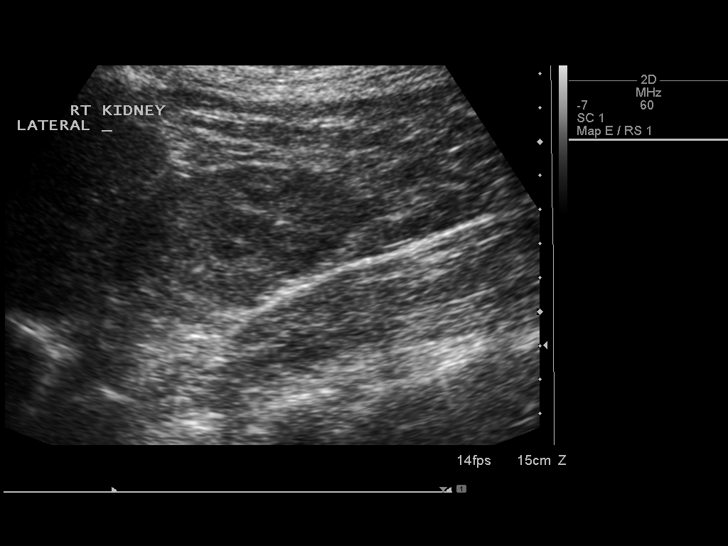
[im 9/36]
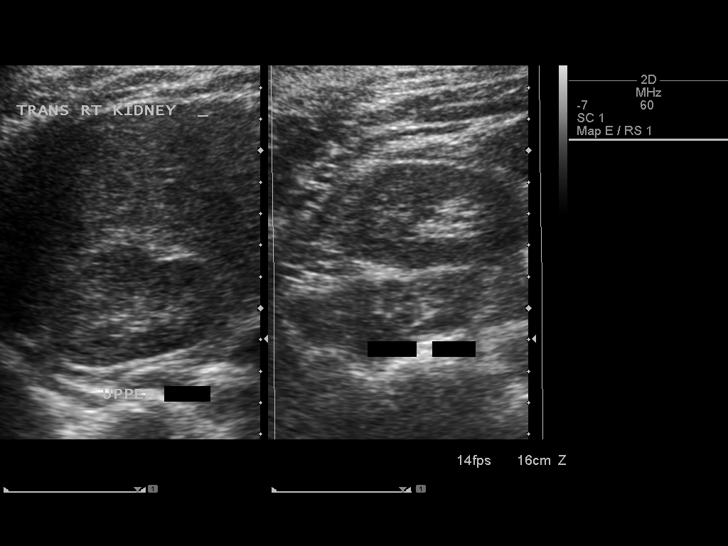
[im 12/36]
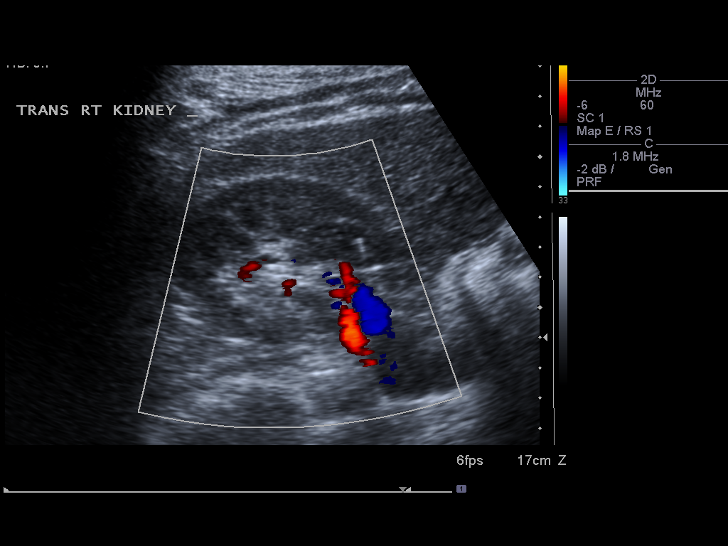
[im 14/36]
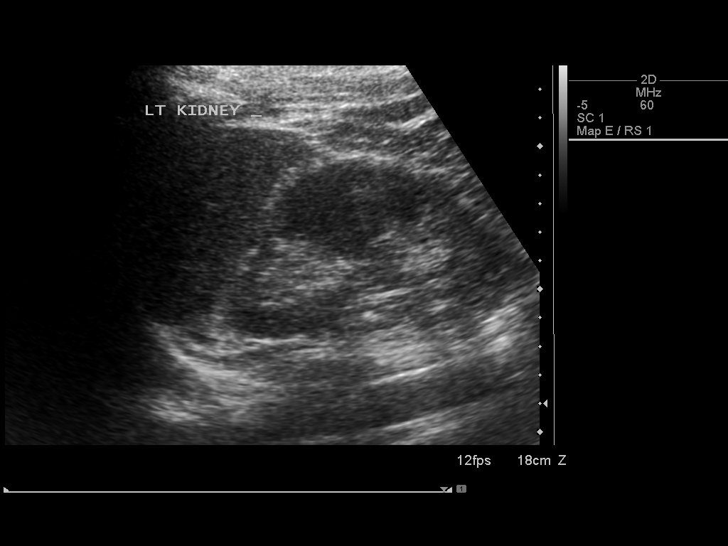
[im 17/36]
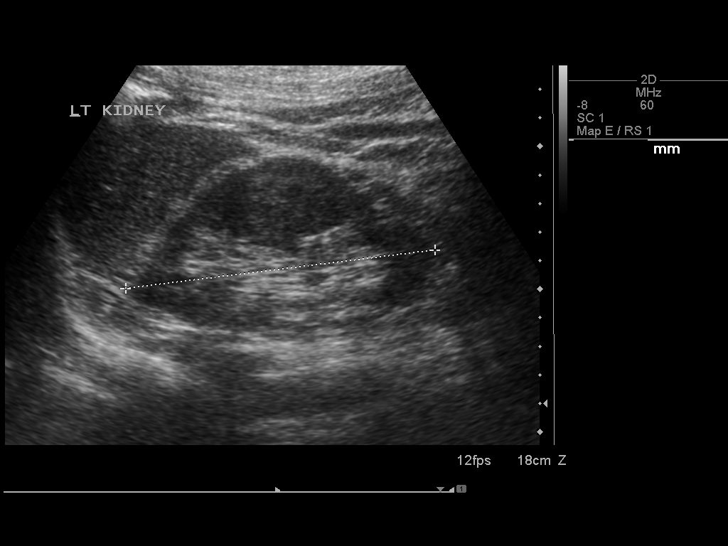
[im 19/36]
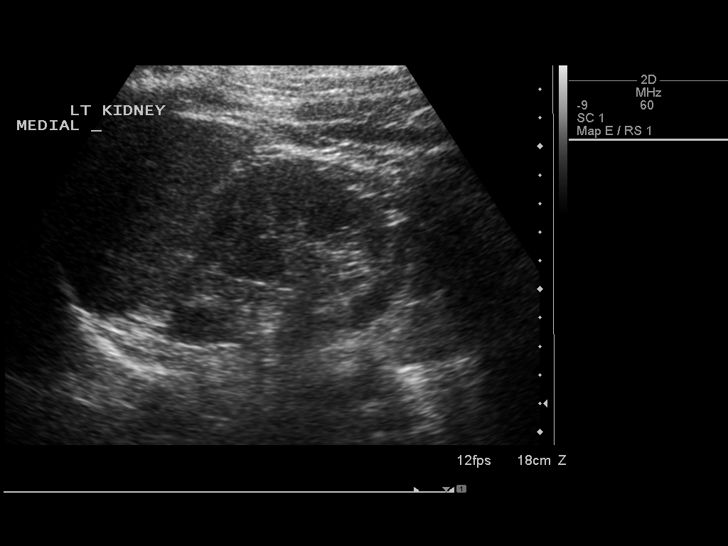
[im 22/36]
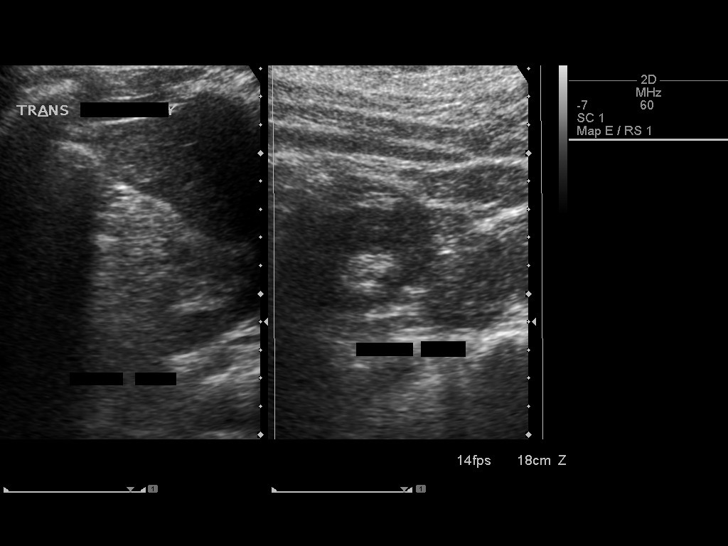
[im 24/36]
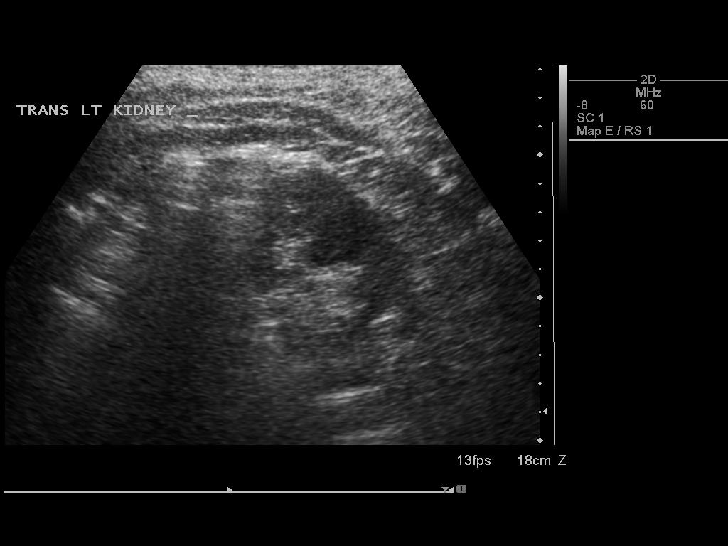
[im 27/36]
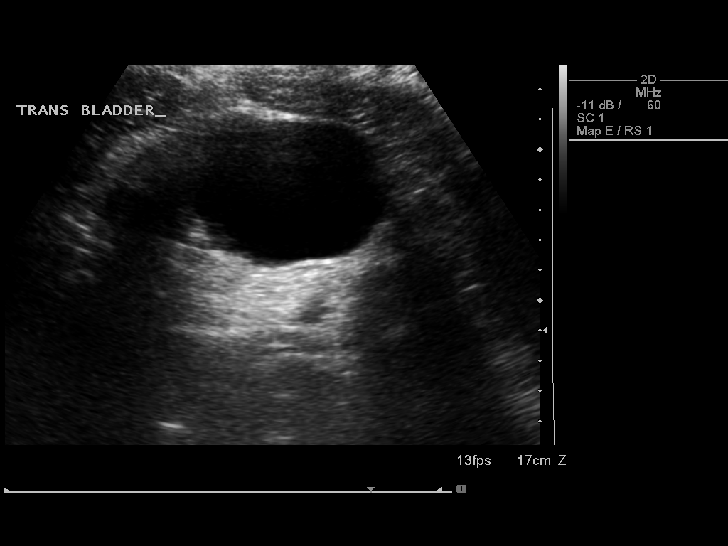
[im 30/36]
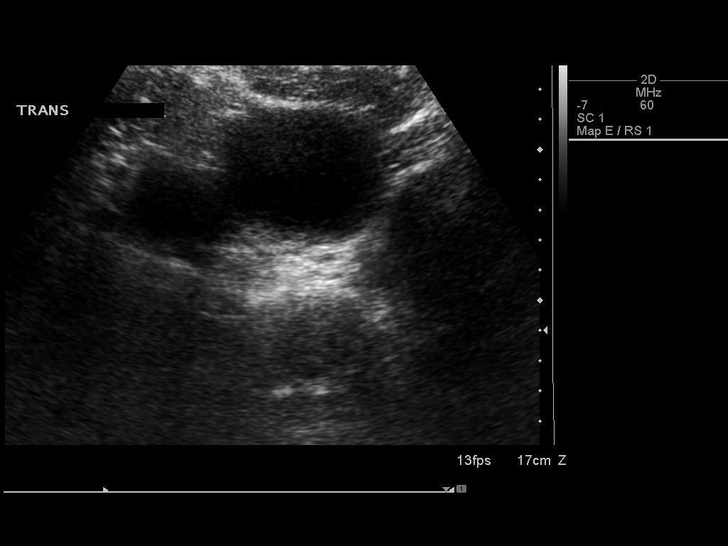
[im 33/36]
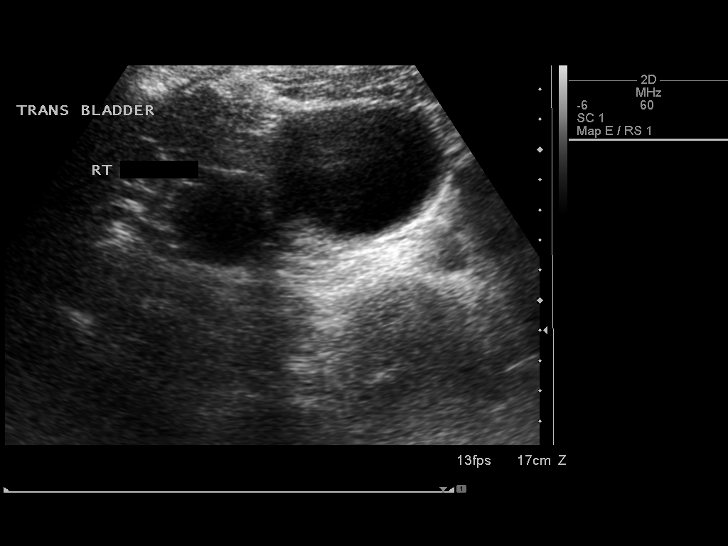
[im 36/36]
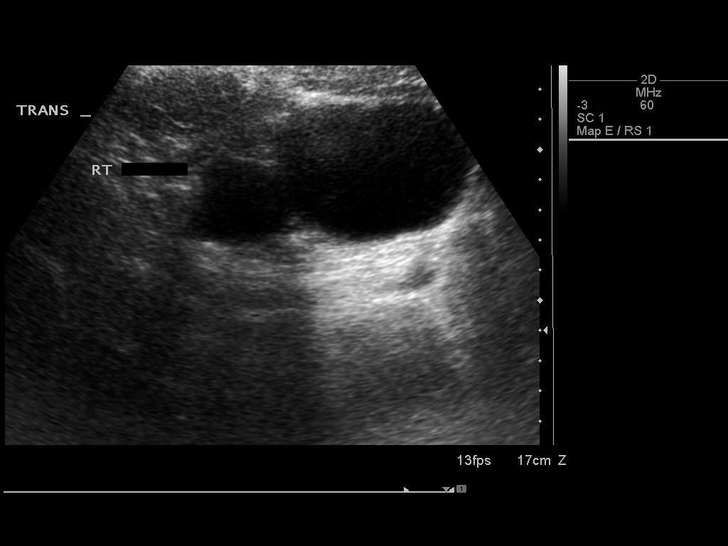

[14 of 25 positions shown; findings below may reference images not displayed]

FINDINGS: Right Kidney:

Length: 10.8 cm. Echogenicity and renal cortical thickness are
within normal limits. No mass, perinephric fluid, or hydronephrosis
visualized. No sonographically demonstrable calculus or
ureterectasis.

Left Kidney:

Length: 10.9 cm. Echogenicity and renal cortical thickness are
within normal limits. No mass, perinephric fluid, or hydronephrosis
visualized. No sonographically demonstrable calculus or
ureterectasis.

Bladder:

Appears normal for degree of bladder distention.

Incidental note is made of a 3.3 x 3.2 cm right ovarian cyst.
IMPRESSION: 3 x 3 x 3.2 cm right ovarian cyst. This is almost certainly a benign
finding.

Study otherwise unremarkable.  Normal appearing kidneys bilaterally.

## 2018-07-16 ENCOUNTER — Encounter: Payer: Self-pay | Admitting: Gastroenterology

## 2018-07-27 DIAGNOSIS — F411 Generalized anxiety disorder: Secondary | ICD-10-CM | POA: Diagnosis not present

## 2018-08-03 DIAGNOSIS — F411 Generalized anxiety disorder: Secondary | ICD-10-CM | POA: Diagnosis not present

## 2018-08-10 DIAGNOSIS — F411 Generalized anxiety disorder: Secondary | ICD-10-CM | POA: Diagnosis not present

## 2018-08-15 DIAGNOSIS — Z6841 Body Mass Index (BMI) 40.0 and over, adult: Secondary | ICD-10-CM | POA: Diagnosis not present

## 2018-08-15 DIAGNOSIS — Z01419 Encounter for gynecological examination (general) (routine) without abnormal findings: Secondary | ICD-10-CM | POA: Diagnosis not present

## 2018-08-15 DIAGNOSIS — N898 Other specified noninflammatory disorders of vagina: Secondary | ICD-10-CM | POA: Diagnosis not present

## 2018-08-17 DIAGNOSIS — F411 Generalized anxiety disorder: Secondary | ICD-10-CM | POA: Diagnosis not present

## 2018-08-20 ENCOUNTER — Ambulatory Visit: Payer: BLUE CROSS/BLUE SHIELD | Admitting: Gastroenterology

## 2018-08-20 NOTE — Progress Notes (Deleted)
Referring Provider: Everardo Beals, NP Primary Care Physician:  Everardo Beals, NP   Reason for Consultation: Diarrhea   IMPRESSION:  Diarrhea  PLAN: ***   HPI: Elizabeth Guerra is a 42 y.o. female seen in consultation at the request of nurse practitioner [place] for further evaluation of diarrhea.  The history is obtained through the patient and review of referral records.  She has hypertension and stage III chronic kidney disease.  She is seen for a 2-week history of watery explosive diarrhea.  It occurs 4-10 times daily.  It is worse with with meals and aggravated by eating.  There is associated abdominal pain, nausea, vomiting and mucus in the stool.  She is under significant stress.  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  Rare NSAIDs.  No recent antibiotics.   Past Medical History:  Diagnosis Date  . Anxiety   . Depression   . Hypertension   . Pregnancy induced hypertension 2002    Past Surgical History:  Procedure Laterality Date  . CESAREAN SECTION     X2, 2000 AND 2002  . TUBAL LIGATION     HULKA CLIPS    Prior to Admission medications   Medication Sig Start Date End Date Taking? Authorizing Provider  ALPRAZolam Duanne Moron) 1 MG tablet Take 1 mg by mouth at bedtime as needed for sleep. Reported on 02/07/2016    [provider]  buPROPion (WELLBUTRIN SR) 150 MG 12 hr tablet Take 150 mg by mouth daily. Reported on 02/07/2016    [provider]  cloNIDine (CATAPRES) 0.1 MG tablet Take 0.1 mg by mouth at bedtime.    [provider]  furosemide (LASIX) 20 MG tablet Take 20 mg by mouth 2 (two) times daily.    [provider]  lisinopril-hydrochlorothiazide (PRINZIDE,ZESTORETIC) 20-25 MG per tablet Take 1 tablet by mouth daily. Reported on 02/07/2016    [provider]  megestrol (MEGACE) 40 MG tablet Take 1 tablet (40 mg total) by mouth daily. Can increase to twice a day for heavy  bleeding 02/08/16   Constant, Peggy, MD  Melatonin 5 MG TABS Take 1 tablet by mouth at bedtime.    [provider]  metroNIDAZOLE (FLAGYL) 500 MG tablet Take 1 tablet (500 mg total) by mouth 2 (two) times daily. 02/08/16   Constant, Peggy, MD  Multiple Vitamin (MULTIVITAMIN WITH MINERALS) TABS tablet Take 1 tablet by mouth daily.    [provider]  OVER THE COUNTER MEDICATION Take 2 capsules by mouth at bedtime. Over the counter, Femdophlis, probiotic supplement    [provider]  valsartan (DIOVAN) 40 MG tablet Take 40 mg by mouth daily.    [provider]    Current Outpatient Medications  Medication Sig Dispense Refill  . ALPRAZolam (XANAX) 1 MG tablet Take 1 mg by mouth at bedtime as needed for sleep. Reported on 02/07/2016    . buPROPion (WELLBUTRIN SR) 150 MG 12 hr tablet Take 150 mg by mouth daily. Reported on 02/07/2016    . cloNIDine (CATAPRES) 0.1 MG tablet Take 0.1 mg by mouth at bedtime.    . furosemide (LASIX) 20 MG tablet Take 20 mg by mouth 2 (two) times daily.    Marland Kitchen lisinopril-hydrochlorothiazide (PRINZIDE,ZESTORETIC) 20-25 MG per tablet Take 1 tablet by mouth daily. Reported on 02/07/2016    . megestrol (MEGACE) 40 MG tablet Take 1 tablet (40 mg total) by mouth daily. Can increase to twice a day  for heavy bleeding 60 tablet 2  . Melatonin 5 MG TABS Take 1 tablet by mouth at bedtime.    . metroNIDAZOLE (FLAGYL) 500 MG tablet Take 1 tablet (500 mg total) by mouth 2 (two) times daily. 14 tablet 0  . Multiple Vitamin (MULTIVITAMIN WITH MINERALS) TABS tablet Take 1 tablet by mouth daily.    Marland Kitchen OVER THE COUNTER MEDICATION Take 2 capsules by mouth at bedtime. Over the counter, Femdophlis, probiotic supplement    . valsartan (DIOVAN) 40 MG tablet Take 40 mg by mouth daily.     No current facility-administered medications for this visit.     Allergies as of 08/20/2018 - Review Complete 08/08/2013  Allergen Reaction Noted  . Adhesive [tape] Other (See  Comments) 08/08/2013  . Amoxicillin Nausea And Vomiting 08/07/2011  . Latex Itching 03/10/2011    Family History  Problem Relation Age of Onset  . Hypertension Mother   . Hypertension Father   . Cancer Father        PROSTATE  . Heart failure Paternal Aunt   . Hypertension Paternal Aunt   . Hypertension Paternal Uncle   . Cancer Maternal Grandmother   . Diabetes Maternal Grandmother        LUNG  . Heart failure Paternal Grandmother   . Hypertension Paternal Grandmother   . Heart failure Paternal Grandfather   . Hypertension Paternal Grandfather     Social History   Socioeconomic History  . Marital status: Divorced    Spouse name: Not on file  . Number of children: Not on file  . Years of education: Not on file  . Highest education level: Not on file  Occupational History  . Not on file  Social Needs  . Financial resource strain: Not on file  . Food insecurity:    Worry: Not on file    Inability: Not on file  . Transportation needs:    Medical: Not on file    Non-medical: Not on file  Tobacco Use  . Smoking status: Never Smoker  . Smokeless tobacco: Never Used  Substance and Sexual Activity  . Alcohol use: Yes    Comment: OCCASIONALLY  . Drug use: No  . Sexual activity: Yes    Birth control/protection: Surgical    Comment: TUBAL LIGATION HULKA CLIPS   Lifestyle  . Physical activity:    Days per week: Not on file    Minutes per session: Not on file  . Stress: Not on file  Relationships  . Social connections:    Talks on phone: Not on file    Gets together: Not on file    Attends religious service: Not on file    Active member of club or organization: Not on file    Attends meetings of clubs or organizations: Not on file    Relationship status: Not on file  . Intimate partner violence:    Fear of current or ex partner: Not on file    Emotionally abused: Not on file    Physically abused: Not on file    Forced sexual activity: Not on file  Other Topics  Concern  . Not on file  Social History Narrative  . Not on file    Review of Systems: 12 system ROS is negative except as noted above.   Physical Exam: Vital signs were reviewed. General:   Alert, well-nourished, pleasant and cooperative in NAD Head:  Normocephalic and atraumatic. Eyes:  Sclera clear, no icterus.   Conjunctiva pink. Mouth:  No deformity or lesions.   Neck:  Supple; no thyromegaly. Lungs:  Clear throughout to auscultation.   No wheezes.  Heart:  Regular rate and rhythm; no murmurs Abdomen:  Soft, nontender, normal bowel sounds. No rebound or guarding. No hepatosplenomegaly Rectal:  Deferred  Msk:  Symmetrical without gross deformities. Extremities:  No gross deformities or edema. Neurologic:  Alert and  oriented x4;  grossly nonfocal Skin:  No rash or bruise. Psych:  Alert and cooperative. Normal mood and affect.   Pranay Hilbun L. Tarri Glenn Md, MPH [facility] 08/20/2018, 1:34 PM

## 2018-08-24 DIAGNOSIS — F411 Generalized anxiety disorder: Secondary | ICD-10-CM | POA: Diagnosis not present

## 2018-08-31 DIAGNOSIS — F411 Generalized anxiety disorder: Secondary | ICD-10-CM | POA: Diagnosis not present

## 2018-09-07 DIAGNOSIS — F411 Generalized anxiety disorder: Secondary | ICD-10-CM | POA: Diagnosis not present

## 2018-09-14 DIAGNOSIS — F411 Generalized anxiety disorder: Secondary | ICD-10-CM | POA: Diagnosis not present

## 2018-09-21 DIAGNOSIS — F411 Generalized anxiety disorder: Secondary | ICD-10-CM | POA: Diagnosis not present

## 2018-10-05 DIAGNOSIS — F411 Generalized anxiety disorder: Secondary | ICD-10-CM | POA: Diagnosis not present

## 2018-10-12 DIAGNOSIS — F411 Generalized anxiety disorder: Secondary | ICD-10-CM | POA: Diagnosis not present

## 2018-10-19 DIAGNOSIS — F411 Generalized anxiety disorder: Secondary | ICD-10-CM | POA: Diagnosis not present

## 2018-10-26 DIAGNOSIS — F411 Generalized anxiety disorder: Secondary | ICD-10-CM | POA: Diagnosis not present

## 2018-10-31 DIAGNOSIS — N920 Excessive and frequent menstruation with regular cycle: Secondary | ICD-10-CM | POA: Diagnosis not present

## 2018-11-02 DIAGNOSIS — F411 Generalized anxiety disorder: Secondary | ICD-10-CM | POA: Diagnosis not present

## 2018-11-09 DIAGNOSIS — F411 Generalized anxiety disorder: Secondary | ICD-10-CM | POA: Diagnosis not present

## 2018-11-14 DIAGNOSIS — R9389 Abnormal findings on diagnostic imaging of other specified body structures: Secondary | ICD-10-CM | POA: Diagnosis not present

## 2018-11-16 DIAGNOSIS — F411 Generalized anxiety disorder: Secondary | ICD-10-CM | POA: Diagnosis not present

## 2018-11-23 DIAGNOSIS — F411 Generalized anxiety disorder: Secondary | ICD-10-CM | POA: Diagnosis not present

## 2018-11-30 DIAGNOSIS — F411 Generalized anxiety disorder: Secondary | ICD-10-CM | POA: Diagnosis not present

## 2018-12-07 DIAGNOSIS — F411 Generalized anxiety disorder: Secondary | ICD-10-CM | POA: Diagnosis not present

## 2018-12-14 DIAGNOSIS — F411 Generalized anxiety disorder: Secondary | ICD-10-CM | POA: Diagnosis not present

## 2018-12-18 DIAGNOSIS — N920 Excessive and frequent menstruation with regular cycle: Secondary | ICD-10-CM | POA: Diagnosis not present

## 2018-12-21 DIAGNOSIS — F411 Generalized anxiety disorder: Secondary | ICD-10-CM | POA: Diagnosis not present

## 2018-12-28 DIAGNOSIS — F411 Generalized anxiety disorder: Secondary | ICD-10-CM | POA: Diagnosis not present

## 2019-01-02 DIAGNOSIS — I129 Hypertensive chronic kidney disease with stage 1 through stage 4 chronic kidney disease, or unspecified chronic kidney disease: Secondary | ICD-10-CM | POA: Diagnosis not present

## 2019-01-02 DIAGNOSIS — N183 Chronic kidney disease, stage 3 (moderate): Secondary | ICD-10-CM | POA: Diagnosis not present

## 2019-01-02 DIAGNOSIS — R109 Unspecified abdominal pain: Secondary | ICD-10-CM | POA: Diagnosis not present

## 2019-01-02 DIAGNOSIS — D649 Anemia, unspecified: Secondary | ICD-10-CM | POA: Diagnosis not present

## 2019-01-04 DIAGNOSIS — F411 Generalized anxiety disorder: Secondary | ICD-10-CM | POA: Diagnosis not present

## 2019-01-11 DIAGNOSIS — F411 Generalized anxiety disorder: Secondary | ICD-10-CM | POA: Diagnosis not present

## 2019-01-18 DIAGNOSIS — F411 Generalized anxiety disorder: Secondary | ICD-10-CM | POA: Diagnosis not present

## 2019-01-27 DIAGNOSIS — F411 Generalized anxiety disorder: Secondary | ICD-10-CM | POA: Diagnosis not present

## 2019-02-08 DIAGNOSIS — F411 Generalized anxiety disorder: Secondary | ICD-10-CM | POA: Diagnosis not present

## 2019-03-19 ENCOUNTER — Other Ambulatory Visit: Payer: Self-pay | Admitting: Obstetrics and Gynecology

## 2019-03-21 ENCOUNTER — Other Ambulatory Visit: Payer: Self-pay

## 2019-03-21 ENCOUNTER — Encounter (HOSPITAL_BASED_OUTPATIENT_CLINIC_OR_DEPARTMENT_OTHER): Payer: Self-pay

## 2019-03-21 NOTE — Progress Notes (Signed)
SPOKE W/  [place]     SCREENING SYMPTOMS OF COVID 19:   COUGH--NO  RUNNY NOSE--- NO  SORE THROAT---NO  NASAL CONGESTION----NO  SNEEZING----NO  SHORTNESS OF BREATH---NO  DIFFICULTY BREATHING---NO  TEMP >100.0 -----NO  UNEXPLAINED BODY ACHES------NO  CHILLS -------- NO  HEADACHES ---------NO  LOSS OF SMELL/ TASTE --------NO    HAVE YOU OR ANY FAMILY MEMBER TRAVELLED PAST 14 DAYS OUT OF THE   COUNTY---NO STATE----NO COUNTRY----NO  HAVE YOU OR ANY FAMILY MEMBER BEEN EXPOSED TO ANYONE WITH COVID 19? NO

## 2019-03-21 NOTE — Progress Notes (Signed)
Spoke with:  Diannah NPO:  After Midnight, no gum, candy, or mints   Arrival time:  0630AM Labs: UPT (CBC/CMP 03/20/2019 on chart, COVID/EKG 03/24/2019 in epic) AM medications: Amlodipine, Megestrol Pre op orders: Yes Ride home:  Remo Lipps (friend) [phone]

## 2019-03-24 ENCOUNTER — Encounter (HOSPITAL_COMMUNITY)
Admission: RE | Admit: 2019-03-24 | Discharge: 2019-03-24 | Disposition: A | Discharge status: Home / Self Care | Payer: PRIVATE HEALTH INSURANCE | Source: Ambulatory Visit | Attending: Obstetrics and Gynecology | Admitting: Obstetrics and Gynecology

## 2019-03-24 ENCOUNTER — Other Ambulatory Visit (HOSPITAL_COMMUNITY)
Admission: RE | Admit: 2019-03-24 | Discharge: 2019-03-24 | Disposition: A | Discharge status: Home / Self Care | Payer: PRIVATE HEALTH INSURANCE | Source: Ambulatory Visit | Attending: Obstetrics and Gynecology | Admitting: Obstetrics and Gynecology

## 2019-03-24 ENCOUNTER — Other Ambulatory Visit: Payer: Self-pay

## 2019-03-24 DIAGNOSIS — Z6841 Body Mass Index (BMI) 40.0 and over, adult: Secondary | ICD-10-CM | POA: Diagnosis not present

## 2019-03-24 DIAGNOSIS — N840 Polyp of corpus uteri: Secondary | ICD-10-CM | POA: Diagnosis not present

## 2019-03-24 DIAGNOSIS — Z79899 Other long term (current) drug therapy: Secondary | ICD-10-CM | POA: Diagnosis not present

## 2019-03-24 DIAGNOSIS — E669 Obesity, unspecified: Secondary | ICD-10-CM | POA: Diagnosis not present

## 2019-03-24 DIAGNOSIS — R9389 Abnormal findings on diagnostic imaging of other specified body structures: Secondary | ICD-10-CM | POA: Diagnosis not present

## 2019-03-24 DIAGNOSIS — N183 Chronic kidney disease, stage 3 (moderate): Secondary | ICD-10-CM | POA: Diagnosis not present

## 2019-03-24 DIAGNOSIS — N920 Excessive and frequent menstruation with regular cycle: Secondary | ICD-10-CM | POA: Diagnosis present

## 2019-03-24 DIAGNOSIS — I129 Hypertensive chronic kidney disease with stage 1 through stage 4 chronic kidney disease, or unspecified chronic kidney disease: Secondary | ICD-10-CM | POA: Diagnosis not present

## 2019-03-24 DIAGNOSIS — Z1159 Encounter for screening for other viral diseases: Secondary | ICD-10-CM | POA: Diagnosis not present

## 2019-03-24 DIAGNOSIS — D509 Iron deficiency anemia, unspecified: Secondary | ICD-10-CM | POA: Diagnosis not present

## 2019-03-24 DIAGNOSIS — N83201 Unspecified ovarian cyst, right side: Secondary | ICD-10-CM | POA: Diagnosis not present

## 2019-03-24 DIAGNOSIS — Z888 Allergy status to other drugs, medicaments and biological substances status: Secondary | ICD-10-CM | POA: Diagnosis not present

## 2019-03-24 DIAGNOSIS — F419 Anxiety disorder, unspecified: Secondary | ICD-10-CM | POA: Diagnosis not present

## 2019-03-24 DIAGNOSIS — F329 Major depressive disorder, single episode, unspecified: Secondary | ICD-10-CM | POA: Diagnosis not present

## 2019-03-24 DIAGNOSIS — N83202 Unspecified ovarian cyst, left side: Secondary | ICD-10-CM | POA: Diagnosis not present

## 2019-03-24 LAB — SARS CORONAVIRUS 2 BY RT PCR (HOSPITAL ORDER, PERFORMED IN ~~LOC~~ HOSPITAL LAB): SARS Coronavirus 2: NEGATIVE

## 2019-03-25 NOTE — Progress Notes (Signed)
SPOKE W/  _Sabrina    SCREENING SYMPTOMS OF COVID 19:   COUGH--no  RUNNY NOSE--- no  SORE THROAT---no  NASAL CONGESTION----no  SNEEZING----no  SHORTNESS OF BREATH---no  DIFFICULTY BREATHING---no  TEMP >100.0 -----no  UNEXPLAINED BODY ACHES------no  CHILLS -------- no  HEADACHES ---------no  LOSS OF SMELL/ TASTE --------no    HAVE YOU OR ANY FAMILY MEMBER TRAVELLED PAST 14 DAYS OUT OF THE   COUNTY---no STATE----no COUNTRY----no  HAVE YOU OR ANY FAMILY MEMBER BEEN EXPOSED TO ANYONE WITH COVID 19? no

## 2019-03-25 NOTE — Progress Notes (Signed)
Received voicemail from [place], dr [employer].  Stated that dr Murrell Redden wants serum pregnancy as ordered not upt.  Pt will needs this on arrival [place].

## 2019-03-26 ENCOUNTER — Encounter (HOSPITAL_BASED_OUTPATIENT_CLINIC_OR_DEPARTMENT_OTHER): Payer: Self-pay

## 2019-03-26 ENCOUNTER — Ambulatory Visit (HOSPITAL_BASED_OUTPATIENT_CLINIC_OR_DEPARTMENT_OTHER): Payer: PRIVATE HEALTH INSURANCE | Admitting: Certified Registered"

## 2019-03-26 ENCOUNTER — Ambulatory Visit (HOSPITAL_BASED_OUTPATIENT_CLINIC_OR_DEPARTMENT_OTHER): Payer: PRIVATE HEALTH INSURANCE | Admitting: Physician Assistant

## 2019-03-26 ENCOUNTER — Ambulatory Visit (HOSPITAL_BASED_OUTPATIENT_CLINIC_OR_DEPARTMENT_OTHER)
Admission: RE | Admit: 2019-03-26 | Discharge: 2019-03-26 | Disposition: A | Discharge status: Home / Self Care | Payer: PRIVATE HEALTH INSURANCE | Attending: Obstetrics and Gynecology | Admitting: Obstetrics and Gynecology

## 2019-03-26 ENCOUNTER — Encounter (HOSPITAL_BASED_OUTPATIENT_CLINIC_OR_DEPARTMENT_OTHER)
Admission: RE | Disposition: A | Discharge status: Home / Self Care | Payer: Self-pay | Source: Home / Self Care | Attending: Obstetrics and Gynecology

## 2019-03-26 DIAGNOSIS — N83201 Unspecified ovarian cyst, right side: Secondary | ICD-10-CM | POA: Insufficient documentation

## 2019-03-26 DIAGNOSIS — R9389 Abnormal findings on diagnostic imaging of other specified body structures: Secondary | ICD-10-CM | POA: Insufficient documentation

## 2019-03-26 DIAGNOSIS — Z888 Allergy status to other drugs, medicaments and biological substances status: Secondary | ICD-10-CM | POA: Insufficient documentation

## 2019-03-26 DIAGNOSIS — N938 Other specified abnormal uterine and vaginal bleeding: Secondary | ICD-10-CM

## 2019-03-26 DIAGNOSIS — D509 Iron deficiency anemia, unspecified: Secondary | ICD-10-CM | POA: Insufficient documentation

## 2019-03-26 DIAGNOSIS — F329 Major depressive disorder, single episode, unspecified: Secondary | ICD-10-CM | POA: Insufficient documentation

## 2019-03-26 DIAGNOSIS — N83202 Unspecified ovarian cyst, left side: Secondary | ICD-10-CM | POA: Insufficient documentation

## 2019-03-26 DIAGNOSIS — N840 Polyp of corpus uteri: Secondary | ICD-10-CM | POA: Diagnosis not present

## 2019-03-26 DIAGNOSIS — Z6841 Body Mass Index (BMI) 40.0 and over, adult: Secondary | ICD-10-CM | POA: Insufficient documentation

## 2019-03-26 DIAGNOSIS — Z79899 Other long term (current) drug therapy: Secondary | ICD-10-CM | POA: Insufficient documentation

## 2019-03-26 DIAGNOSIS — N183 Chronic kidney disease, stage 3 (moderate): Secondary | ICD-10-CM | POA: Insufficient documentation

## 2019-03-26 DIAGNOSIS — E669 Obesity, unspecified: Secondary | ICD-10-CM | POA: Insufficient documentation

## 2019-03-26 DIAGNOSIS — N920 Excessive and frequent menstruation with regular cycle: Secondary | ICD-10-CM | POA: Insufficient documentation

## 2019-03-26 DIAGNOSIS — Z1159 Encounter for screening for other viral diseases: Secondary | ICD-10-CM | POA: Insufficient documentation

## 2019-03-26 DIAGNOSIS — I129 Hypertensive chronic kidney disease with stage 1 through stage 4 chronic kidney disease, or unspecified chronic kidney disease: Secondary | ICD-10-CM | POA: Insufficient documentation

## 2019-03-26 DIAGNOSIS — F419 Anxiety disorder, unspecified: Secondary | ICD-10-CM | POA: Insufficient documentation

## 2019-03-26 HISTORY — DX: Unspecified ovarian cyst, right side: N83.201

## 2019-03-26 HISTORY — DX: Polyp of corpus uteri: N84.0

## 2019-03-26 HISTORY — DX: Iron deficiency anemia, unspecified: D50.9

## 2019-03-26 HISTORY — DX: Other specified abnormal uterine and vaginal bleeding: N93.8

## 2019-03-26 HISTORY — DX: Chronic kidney disease, stage 3 unspecified: N18.30

## 2019-03-26 HISTORY — DX: Obesity, unspecified: E66.9

## 2019-03-26 HISTORY — PX: DILATATION & CURETTAGE/HYSTEROSCOPY WITH MYOSURE: SHX6511

## 2019-03-26 LAB — HCG, SERUM, QUALITATIVE: Preg, Serum: NEGATIVE

## 2019-03-26 SURGERY — DILATATION & CURETTAGE/HYSTEROSCOPY WITH MYOSURE
Anesthesia: General

## 2019-03-26 MED ORDER — ONDANSETRON HCL 4 MG/2ML IJ SOLN
INTRAMUSCULAR | Status: DC | PRN
  Administered 2019-03-26: 4 mg via INTRAVENOUS

## 2019-03-26 MED ORDER — SUCCINYLCHOLINE CHLORIDE 200 MG/10ML IV SOSY
PREFILLED_SYRINGE | INTRAVENOUS | Status: AC
  Filled 2019-03-26: qty 10

## 2019-03-26 MED ORDER — MEGESTROL ACETATE 40 MG PO TABS: 40.0000 mg | ORAL_TABLET | Freq: Every day | ORAL | 0 refills | Status: DC

## 2019-03-26 MED ORDER — PROPOFOL 10 MG/ML IV BOLUS
INTRAVENOUS | Status: AC
  Filled 2019-03-26: qty 20

## 2019-03-26 MED ORDER — LACTATED RINGERS IV SOLN
INTRAVENOUS | Status: DC
  Administered 2019-03-26: 08:00:00 via INTRAVENOUS
  Filled 2019-03-26: qty 1000

## 2019-03-26 MED ORDER — LIDOCAINE 2% (20 MG/ML) 5 ML SYRINGE
INTRAMUSCULAR | Status: DC | PRN
  Administered 2019-03-26: 60 mg via INTRAVENOUS

## 2019-03-26 MED ORDER — SUCCINYLCHOLINE CHLORIDE 200 MG/10ML IV SOSY
PREFILLED_SYRINGE | INTRAVENOUS | Status: DC | PRN
  Administered 2019-03-26: 140 mg via INTRAVENOUS

## 2019-03-26 MED ORDER — MIDAZOLAM HCL 2 MG/2ML IJ SOLN
INTRAMUSCULAR | Status: AC
  Filled 2019-03-26: qty 2

## 2019-03-26 MED ORDER — LIDOCAINE 2% (20 MG/ML) 5 ML SYRINGE
INTRAMUSCULAR | Status: AC
  Filled 2019-03-26: qty 5

## 2019-03-26 MED ORDER — SODIUM CHLORIDE 0.9 % IR SOLN
Status: DC | PRN
  Administered 2019-03-26: 6000 mL

## 2019-03-26 MED ORDER — IBUPROFEN 800 MG PO TABS: 800.0000 mg | ORAL_TABLET | Freq: Three times a day (TID) | ORAL | 0 refills | Status: DC | PRN

## 2019-03-26 MED ORDER — FENTANYL CITRATE (PF) 250 MCG/5ML IJ SOLN
INTRAMUSCULAR | Status: DC | PRN
  Administered 2019-03-26 (×2): 50 ug via INTRAVENOUS

## 2019-03-26 MED ORDER — DEXAMETHASONE SODIUM PHOSPHATE 10 MG/ML IJ SOLN
INTRAMUSCULAR | Status: AC
  Filled 2019-03-26: qty 1

## 2019-03-26 MED ORDER — DEXAMETHASONE SODIUM PHOSPHATE 10 MG/ML IJ SOLN
INTRAMUSCULAR | Status: DC | PRN
  Administered 2019-03-26: 5 mg via INTRAVENOUS

## 2019-03-26 MED ORDER — FENTANYL CITRATE (PF) 100 MCG/2ML IJ SOLN
INTRAMUSCULAR | Status: AC
  Filled 2019-03-26: qty 2

## 2019-03-26 MED ORDER — PROPOFOL 10 MG/ML IV BOLUS
INTRAVENOUS | Status: DC | PRN
  Administered 2019-03-26: 200 mg via INTRAVENOUS

## 2019-03-26 MED ORDER — ONDANSETRON HCL 4 MG/2ML IJ SOLN
INTRAMUSCULAR | Status: AC
  Filled 2019-03-26: qty 2

## 2019-03-26 MED ORDER — MIDAZOLAM HCL 5 MG/5ML IJ SOLN
INTRAMUSCULAR | Status: DC | PRN
  Administered 2019-03-26: 2 mg via INTRAVENOUS

## 2019-03-26 MED ORDER — FENTANYL CITRATE (PF) 100 MCG/2ML IJ SOLN
25.0000 ug | INTRAMUSCULAR | Status: DC | PRN
  Administered 2019-03-26 (×2): 50 ug via INTRAVENOUS
  Filled 2019-03-26: qty 1

## 2019-03-26 SURGICAL SUPPLY — 17 items
CATH ROBINSON RED A/P 16FR (CATHETERS) ×2 IMPLANT
DEVICE MYOSURE LITE (MISCELLANEOUS) ×1 IMPLANT
DEVICE MYOSURE REACH (MISCELLANEOUS) IMPLANT
GLOVE BIOGEL PI IND STRL 6.5 (GLOVE) ×1 IMPLANT
GLOVE BIOGEL PI IND STRL 7.0 (GLOVE) ×1 IMPLANT
GLOVE BIOGEL PI INDICATOR 6.5 (GLOVE) ×1
GLOVE BIOGEL PI INDICATOR 7.0 (GLOVE) ×1
GLOVE ECLIPSE 6.5 STRL STRAW (GLOVE) ×2 IMPLANT
GOWN STRL REUS W/ TWL LRG LVL3 (GOWN DISPOSABLE) ×2 IMPLANT
GOWN STRL REUS W/TWL LRG LVL3 (GOWN DISPOSABLE) ×4
HIBICLENS CHG 4% 4OZ BTL (MISCELLANEOUS) IMPLANT
KIT PROCEDURE FLUENT (KITS) ×2 IMPLANT
PACK VAGINAL MINOR WOMEN LF (CUSTOM PROCEDURE TRAY) ×2 IMPLANT
PAD OB MATERNITY 4.3X12.25 (PERSONAL CARE ITEMS) ×2 IMPLANT
SEAL CERVICAL OMNI LOK (ABLATOR) IMPLANT
SEAL ROD LENS SCOPE MYOSURE (ABLATOR) ×2 IMPLANT
TOWEL OR 17X26 10 PK STRL BLUE (TOWEL DISPOSABLE) ×2 IMPLANT

## 2019-03-26 NOTE — Discharge Instructions (Signed)
°  Post Anesthesia Home Care Instructions  Activity: Get plenty of rest for the remainder of the day. A responsible individual must stay with you for 24 hours following the procedure.  For the next 24 hours, DO NOT: -Drive a car -Paediatric nurse -Drink alcoholic beverages -Take any medication unless instructed by your physician -Make any legal decisions or sign important papers.  Meals: Start with liquid foods such as gelatin or soup. Progress to regular foods as tolerated. Avoid greasy, spicy, heavy foods. If nausea and/or vomiting occur, drink only clear liquids until the nausea and/or vomiting subsides. Call your physician if vomiting continues.  Special Instructions/Symptoms: Your throat may feel dry or sore from the anesthesia or the breathing tube placed in your throat during surgery. If this causes discomfort, gargle with warm salt water. The discomfort should disappear within 24 hours.  May take tylenol as needed for pain. Supplement With Ibuprofen if needed.

## 2019-03-26 NOTE — Anesthesia Preprocedure Evaluation (Addendum)
Anesthesia Evaluation  Patient identified by MRN, date of birth, ID band Patient awake    Reviewed: Allergy & Precautions, NPO status , Patient's Chart, lab work & pertinent test results  Airway Mallampati: II  TM Distance: >3 FB     Dental   Pulmonary    breath sounds clear to auscultation       Cardiovascular hypertension,  Rhythm:Regular Rate:Normal     Neuro/Psych    GI/Hepatic negative GI ROS, Neg liver ROS,   Endo/Other    Renal/GU Renal disease     Musculoskeletal   Abdominal   Peds  Hematology  (+) anemia ,   Anesthesia Other Findings   Reproductive/Obstetrics                             Anesthesia Physical Anesthesia Plan  ASA: III  Anesthesia Plan: General   Post-op Pain Management:    Induction: Intravenous  PONV Risk Score and Plan: Ondansetron, Dexamethasone and Midazolam  Airway Management Planned: Oral ETT  Additional Equipment:   Intra-op Plan:   Post-operative Plan: Extubation in OR  Informed Consent: I have reviewed the patients History and Physical, chart, labs and discussed the procedure including the risks, benefits and alternatives for the proposed anesthesia with the patient or authorized representative who has indicated his/her understanding and acceptance.     Dental advisory given  Plan Discussed with: CRNA and Anesthesiologist  Anesthesia Plan Comments:         Anesthesia Quick Evaluation

## 2019-03-26 NOTE — Op Note (Signed)
Preoperative diagnosis: menorrhagia, endometrial polyp  Postop diagnosis: as above.  Procedure: Hysteroscopic polypectomy with Jacklynn Barnacle, D&C Anesthesia General via LMA  Surgeon: Tiana Loft, MD  Assistant: n/a IV fluids : 678ml Estimated blood loss : 57ml Urine output: straight catheter preop: 150ml, clear urine   Complications none  Condition stable  Disposition PACU  Specimen: endometrial polyp with endometrial curettings   Procedure  Indication: Menorrhagia. Office sono noted thickened endometrium and SIS showed findings c/w endometrial polyp. Patient was counseled on risks/ complications including infection, bleeding, damage to internal organs, she understood and agrees, gave informed written consent.  Patient was brought to the operating room with IV running. Time out was carried out.  She underwent general anesthesia via LMA without complications. She was given dorsolithotomy position. Parts were prepped and draped in standard fashion. Bladder was catheterized once. Bimanual exam revealed uterus to be anteverted and normal size. Speculum was placed and cervix was grasped with single-tooth tenaculum.  The uterus was sounded to 7 cm. Cervical os was dilated to 21 [place] dilator. Hysteroscope was introduced in the uterine cavity under vision. Findings: thickened endometrium and what appeared to be polyps both anteriorly and posteriorly. The myosure was then advanced and the polyps removed until cavity appeared within normal limits. Specimen sent to path. Hysteroscope and myosure were removed.  All tissue sent to path.  Fluid deficit 290 cc.  All counts are correct x2. No complications. Patient was made supine dorsal anesthesia and brought to the recovery room in stable condition.  Patient will be discharged home today. Discharge meds ibuprofen. Follow up in 2 weeks in office. Warning signs of infection and excessive bleeding reviewed.

## 2019-03-26 NOTE — Discharge Summary (Signed)
Physician Discharge Summary  Patient ID: Elizabeth Guerra MRN: [id number] DOB/AGE: Feb 20, 1976 43 y.o.  Admit date: 03/26/2019 Discharge date: 03/26/2019  Admission Diagnoses: scheduled procedure  Discharge Diagnoses: s/p d&c, hysteroscopy, polypectomy Active Problems:   * No active hospital problems. *   Discharged Condition: good  Hospital Course: underwent uncomplicated procedure as above Consults: None  Significant Diagnostic Studies: none  Treatments: surgery: as above  Discharge Exam: Blood pressure (!) 151/86, pulse (!) 110, temperature 98.4 F (36.9 C), resp. rate 18, height 5\' 4"  (1.626 m), weight 120 kg, SpO2 100 %.   Disposition: Discharge disposition: 01-Home or Self Care       Discharge Instructions    Activity as tolerated   Complete by:  As directed    Pelvic rest x4 wks   Call MD for:  difficulty breathing, headache or visual disturbances   Complete by:  As directed    Call MD for:  extreme fatigue   Complete by:  As directed    Call MD for:  hives   Complete by:  As directed    Call MD for:  persistant dizziness or light-headedness   Complete by:  As directed    Call MD for:  persistant nausea and vomiting   Complete by:  As directed    Call MD for:  redness, tenderness, or signs of infection (pain, swelling, redness, odor or green/yellow discharge around incision site)   Complete by:  As directed    Call MD for:  severe uncontrolled pain   Complete by:  As directed    Call MD for:  temperature >100.4   Complete by:  As directed    Diet - low sodium heart healthy   Complete by:  As directed    Increase activity slowly   Complete by:  As directed      Allergies as of 03/26/2019      Reactions   Adhesive [tape] Other (See Comments)   welps   Amoxicillin Nausea And Vomiting   HEADACHES   Latex Itching      Medication List    STOP taking these medications   metroNIDAZOLE 500 MG tablet Commonly known as:  Flagyl   naproxen sodium 220 MG  tablet Commonly known as:  ALEVE     TAKE these medications   amLODipine 5 MG tablet Commonly known as:  NORVASC Take 5 mg by mouth 2 (two) times a day.   cloNIDine 0.1 MG tablet Commonly known as:  CATAPRES Take 0.1 mg by mouth at bedtime.   cyclobenzaprine 10 MG tablet Commonly known as:  FLEXERIL Take 10 mg by mouth as needed for muscle spasms.   ferrous sulfate 325 (65 FE) MG tablet Take 325 mg by mouth 2 (two) times daily with a meal.   Lasix 20 MG tablet Generic drug:  furosemide Take 20 mg by mouth 2 (two) times daily.   megestrol 40 MG tablet Commonly known as:  MEGACE Take 1 tablet (40 mg total) by mouth daily. Can increase to twice a day for heavy bleeding   Melatonin 5 MG Tabs Take 1 tablet by mouth as needed.   multivitamin with minerals Tabs tablet Take 1 tablet by mouth daily.   OVER THE COUNTER MEDICATION Take 2 capsules by mouth at bedtime. Over the counter, Femdophlis, probiotic supplement   valsartan 40 MG tablet Commonly known as:  DIOVAN Take 160 mg by mouth 2 (two) times daily.   vitamin C 500 MG tablet Commonly known as:  ASCORBIC ACID Take 500 mg by mouth 2 (two) times daily.      Follow-up Information    Ameriah Lint, Earlyne Iba, MD Follow up.   Specialty:  Obstetrics and Gynecology Contact information: [address] [place] [address] [phone]           Signed: Charyl Bigger 03/26/2019, 10:02 AM

## 2019-03-26 NOTE — Anesthesia Procedure Notes (Signed)
Procedure Name: Intubation Date/Time: 03/26/2019 9:22 AM Performed by: Myna Bright, CRNA Pre-anesthesia Checklist: Patient identified, Emergency Drugs available, Suction available and Patient being monitored Patient Re-evaluated:Patient Re-evaluated prior to induction Oxygen Delivery Method: Circle system utilized Preoxygenation: Pre-oxygenation with 100% oxygen Induction Type: IV induction Laryngoscope Size: Mac and 4 Grade View: Grade II Tube type: Oral Tube size: 7.0 mm Number of attempts: 1 Airway Equipment and Method: Stylet Placement Confirmation: ETT inserted through vocal cords under direct vision,  positive ETCO2 and breath sounds checked- equal and bilateral Secured at: 22 cm Tube secured with: Tape Dental Injury: Teeth and Oropharynx as per pre-operative assessment

## 2019-03-26 NOTE — Anesthesia Postprocedure Evaluation (Signed)
Anesthesia Post Note  Patient: Elizabeth Guerra  Procedure(s) Performed: DILATATION & CURETTAGE/HYSTEROSCOPY WITH MYOSURE (N/A )     Patient location during evaluation: PACU Anesthesia Type: General Level of consciousness: awake Pain management: pain level controlled Vital Signs Assessment: post-procedure vital signs reviewed and stable Respiratory status: spontaneous breathing Postop Assessment: no apparent nausea or vomiting Anesthetic complications: no    Last Vitals:  Vitals:   03/26/19 1015 03/26/19 1030  BP: (!) 160/86 135/81  Pulse: 85 76  Resp: 12 12  Temp:    SpO2: 100% 98%    Last Pain:  Vitals:   03/26/19 1030  TempSrc:   PainSc: 5                  Marticia Reifschneider

## 2019-03-26 NOTE — H&P (Addendum)
Elizabeth Guerra is an 43 y.o. female, G2P2 here for D&C, h/s, polypectomy. The patient has a h/o recent  Menorrhagia to anemia, SIS performed and c/w endometrial polyps and thickened endometrium.  She is currently on megace 40mg  q day to suppress her bleeding and has been on iron for anemia.  She has a h/o renal failure (stage 3) and was cleared for surgery.  Pertinent Gynecological History:  Menstrual History:  No LMP recorded.    Past Medical History:  Diagnosis Date  . Anxiety   . Bilateral ovarian cysts    3 x 3 x 3.2 cm right ovarian cyst, 6.6 cm cyst on the left ovary   . Chronic kidney disease (CKD), stage III (moderate) (HCC)   . Depression   . DUB (dysfunctional uterine bleeding)   . Endometrial polyp   . Hypertension   . Iron deficiency anemia   . Obesity   . Pregnancy induced hypertension 2002    Past Surgical History:  Procedure Laterality Date  . CESAREAN SECTION     X2, 2000 AND 2002  . TUBAL LIGATION     HULKA CLIPS  . WISDOM TOOTH EXTRACTION      Family History  Problem Relation Age of Onset  . Hypertension Mother   . Hypertension Father   . Cancer Father        PROSTATE  . Heart failure Paternal Aunt   . Hypertension Paternal Aunt   . Hypertension Paternal Uncle   . Cancer Maternal Grandmother   . Diabetes Maternal Grandmother        LUNG  . Heart failure Paternal Grandmother   . Hypertension Paternal Grandmother   . Heart failure Paternal Grandfather   . Hypertension Paternal Grandfather     Social History:  reports that she has never smoked. She has never used smokeless tobacco. She reports current alcohol use. She reports that she does not use drugs.  Allergies:  Allergies  Allergen Reactions  . Adhesive [Tape] Other (See Comments)    welps  . Amoxicillin Nausea And Vomiting    HEADACHES  . Latex Itching    Medications Prior to Admission  Medication Sig Dispense Refill Last Dose  . amLODipine (NORVASC) 5 MG tablet Take 5 mg by mouth  2 (two) times a day.   03/25/2019 at Unknown time  . cloNIDine (CATAPRES) 0.1 MG tablet Take 0.1 mg by mouth at bedtime.   03/25/2019 at Unknown time  . cyclobenzaprine (FLEXERIL) 10 MG tablet Take 10 mg by mouth as needed for muscle spasms.   Past Week at Unknown time  . ferrous sulfate 325 (65 FE) MG tablet Take 325 mg by mouth 2 (two) times daily with a meal.   03/25/2019 at Unknown time  . furosemide (LASIX) 20 MG tablet Take 20 mg by mouth 2 (two) times daily.   03/25/2019 at Unknown time  . megestrol (MEGACE) 40 MG tablet Take 1 tablet (40 mg total) by mouth daily. Can increase to twice a day for heavy bleeding 60 tablet 2 03/25/2019 at Unknown time  . naproxen sodium (ALEVE) 220 MG tablet Take 220 mg by mouth as needed.   Past Week at Unknown time  . OVER THE COUNTER MEDICATION Take 2 capsules by mouth at bedtime. Over the counter, Femdophlis, probiotic supplement   Past Week at Unknown time  . valsartan (DIOVAN) 40 MG tablet Take 160 mg by mouth 2 (two) times daily.    03/25/2019 at Unknown time  . vitamin C (  ASCORBIC ACID) 500 MG tablet Take 500 mg by mouth 2 (two) times daily.   03/25/2019 at Unknown time  . Melatonin 5 MG TABS Take 1 tablet by mouth as needed.    More than a month at Unknown time  . metroNIDAZOLE (FLAGYL) 500 MG tablet Take 1 tablet (500 mg total) by mouth 2 (two) times daily. 14 tablet 0   . Multiple Vitamin (MULTIVITAMIN WITH MINERALS) TABS tablet Take 1 tablet by mouth daily.   More than a month at Unknown time    ROS SOB/chest pain/ HA/ vision changes/ LE pain or swelling/ etc.  Blood pressure (!) 159/101, pulse (!) 110, temperature 98 F (36.7 C), temperature source Oral, resp. rate 18, height 5\' 4"  (1.626 m), weight 120 kg, SpO2 100 %. Physical Exam A&O x 3 HEENT : grossly wnl Lungs : ctab CV : rrr Abdo : soft, nt Extr : no edema, nt Pelvic : deferred   Results for orders placed or performed during the hospital encounter of 03/26/19 (from the past 24 hour(s))  hCG,  serum, qualitative     Status: None   Collection Time: 03/26/19  7:22 AM  Result Value Ref Range   Preg, Serum NEGATIVE NEGATIVE      No results found.  Assessment/Plan:  43 y/o with suspected endometrial polyps, menorrhagia, anemia 1. Proceed to OR for planned procedure.  Patient is aware of risk of bleeding, infection, uterine perforation, risk of further surgery, risk of anesthesia.  Consent signed. 2. Mild anemia - contin iron 3. Chronic renal failure - creatine 1.5 1 wk ago, cleared by nephrology Charyl Bigger 03/26/2019, 9:07 AM

## 2019-03-26 NOTE — Transfer of Care (Signed)
Immediate Anesthesia Transfer of Care Note  Patient: Elizabeth Guerra  Procedure(s) Performed: DILATATION & CURETTAGE/HYSTEROSCOPY WITH MYOSURE (N/A )  Patient Location: PACU  Anesthesia Type:General  Level of Consciousness: awake, alert , oriented and patient cooperative  Airway & Oxygen Therapy: Patient Spontanous Breathing and Patient connected to nasal cannula oxygen  Post-op Assessment: Report given to RN, Post -op Vital signs reviewed and stable and Patient moving all extremities  Post vital signs: Reviewed  Last Vitals:  Vitals Value Taken Time  BP 151/86 03/26/2019 10:02 AM  Temp 36.9 C 03/26/2019 10:02 AM  Pulse 93 03/26/2019 10:04 AM  Resp 12 03/26/2019 10:04 AM  SpO2 100 % 03/26/2019 10:04 AM  Vitals shown include unvalidated device data.  Last Pain:  Vitals:   03/26/19 1002  TempSrc:   PainSc: 5       Patients Stated Pain Goal: 5 (84/03/35 3317)  Complications: No apparent anesthesia complications

## 2019-03-27 ENCOUNTER — Encounter (HOSPITAL_BASED_OUTPATIENT_CLINIC_OR_DEPARTMENT_OTHER): Payer: Self-pay | Admitting: Obstetrics and Gynecology

## 2019-07-02 ENCOUNTER — Other Ambulatory Visit: Payer: Self-pay | Admitting: Obstetrics and Gynecology

## 2019-08-06 NOTE — Progress Notes (Signed)
Patient states that she is a difficult stick and "has metldowns" with multiple attempts.

## 2019-08-07 DIAGNOSIS — D219 Benign neoplasm of connective and other soft tissue, unspecified: Secondary | ICD-10-CM

## 2019-08-07 DIAGNOSIS — N920 Excessive and frequent menstruation with regular cycle: Secondary | ICD-10-CM

## 2019-08-07 NOTE — H&P (Addendum)
Elizabeth Guerra is a 43 y.o. female, P: 2-0-0-2 presents for a hysterectomy because of menorrhagia and symptomatic uterine fibroids. Over the past 15 years the patient has had intermittent episodes of what she considered heavy bleeding. Her typical period would last 7 days with the need to change a pad every 1-3 hours. She admits to cramping (rated 5/10) but fines relief with over the counter Ibuprofen. In December of 2019 however, she began to bleed and did so continuously until July 2020. During that time she would pass clots and change a pad hourly, on her heaviest days and on many occasions soiled her clothes or linen. On days when bleeding was less she could go for 3 hours before a change would be needed. During this time she was prescribed Megace and Slynd with very little change in her flow. In June 2020 she underwent a hysteroscopic polypectomy and after that time-in July, she ceased to bleed.. She denies any changes in bowel or bladder function or dyspareunia. A pelvic ultrasound in August 2020 revealed: anteverted uterus: [fundus to external os-10.9 cm] 7.79 x 5.52 x 6.38 cm, endometrium: 0.74 cm, #3 fibroids: anterior left sub-serosal-2.16 cm; posterior left sub-serosal-2.24 cm and right intramural-1.37 cm; right ovary not seen and left ovary: 7.91 cm with a simple cyst with a single septation-7.79 cm. In September her hemoglobin was 10.7 but her Prolactin, TSH and Hemoglobin A1C were all normal. An endometrial biopsy dated 03/27/2019 returned endometrial polyp with no malignancy.  A review of both medical and surgical management options were given to the patient however, the patient has chosen to proceed with definitive therapy in the form of hysterectomy.  Past Medical History  OB History: G: 2-0-0-2;  C-sections: 2000 and 2002  GYN History: menarche: 43 YO    LMP: see HPI    Contraception: Tubal Sterilization;   Denies history of abnormal PAP smear.   Last PAP smear: 2019-normal  Medical History:  Stage 3 Renal Disease, Hypertension, Anemia, Endometrial Polyps, Eczema and Panic Attacks  Surgical History: 2002 Tubal Sterilization; 2020 Hysteroscopic Polypectomy/Dilatation and Curettage Denies problems with anesthesia. Underwent a blood transfusion inf 2012 due to menorrhagia.    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 Adhesive [Tape] Other (See Comments)    welps  . Amoxicillin Nausea And Vomiting    HEADACHES  . Latex Itching    Denies sensitivity to peanuts, shellfish, or  soy products.  Admits to sensitivity to adhesions.  ROS: Admits to glasses, skin rashes  (eczema) but  denies headache, vision changes, nasal congestion, dysphagia, tinnitus, dizziness, hoarseness, cough,  chest pain, shortness of breath, nausea, vomiting, diarrhea,constipation,  urinary frequency, urgency  dysuria, hematuria, vaginitis symptoms, pelvic pain, swelling of joints,easy bruising,  myalgias, arthralgias,  unexplained weight loss and except as is mentioned in the history of present illness, patient's review of systems is otherwise negative.     Physical Exam  Bp: 116/70  P: 85 bpm  R: 20  Temperature: 97.1 degrees F orally Weight: 274 lbs.  Height: 5'4" BMI: 47  Neck: supple without masses or thyromegaly Lungs: clear to auscultation Heart: regular rate and rhythm Abdomen: soft, non-tender and no organomegaly Pelvic:EGBUS- wnl; vagina-normal rugae; uterus(exam  limited by habitus) irregular, upper limits of normal size, cervix without lesions or motion tenderness; adnexae-no tenderness or masses Extremities:  no clubbing, cyanosis or edema   Assesment:   Menorrhagia                       Uterine Fibroids                       Left Ovarian Cyst   Disposition:  A discussion was held with patient regarding the indication for her procedure(s) along with the risks, which include but are not limited to: reaction to anesthesia, damage to adjacent organs, infection, excessive bleeding and possible need for an open abdominal incision.  The patient verbalized understanding of these risks and has consented to proceed with a Total Laparoscopic Hysterectomy, Bilateral Salpingectomy, Left Ovarian Cystectomy with Possible Laparoscopically Assisted Vaginal Hysterectomy and Cystoscopy at [facility] on October 29,2020.  CSN# [id number]   [place] J. Florene Glen, PA-C  for Dr. Harvie Bridge. Mancel Bale  R/b/a reviewed with the patient.  The patient has also consented for possible abdominal hysterectomy.  Questions answered.  Consent s/w.

## 2019-08-18 NOTE — Progress Notes (Signed)
[place], [place] AT [facility] [address] [place] [address] Phone: [phone] Fax: [phone]  [place] [address] [place] - [address] [place] [address] Phone: [phone] Fax: [phone]    Your procedure is scheduled on Thursday, October 29th.  Report to [facility] Main Entrance "A" at 7:30 A.M., and check in at the Admitting office.  Call this number if you have problems the morning of surgery:  [phone]  Call [phone] if you have any questions prior to your surgery date Monday-Friday 8am-4pm   Remember:  Do not eat or drink after midnight the night before your surgery    Take these medicines the morning of surgery with A SIP OF WATER amLODipine (NORVASC)  If needed - cloNIDine (CATAPRES),   As of today, STOP taking any Aspirin (unless otherwise instructed by your surgeon), Aleve, Naproxen, Ibuprofen, Motrin, Advil, Goody's, BC's, all herbal medications, fish oil, and all vitamins.  The Morning of Surgery  Do not wear jewelry, make-up or nail polish.  Do not wear lotions, powders, perfumes,  or deodorant  Do not shave 48 hours prior to surgery.    Do not bring valuables to the hospital.  [facility] is not responsible for any belongings or valuables.  If you are a smoker, DO NOT Smoke 24 hours prior to surgery IF you wear a CPAP at night please bring your mask, tubing, and machine the morning of surgery   Remember that you must have someone to transport you home after your surgery, and remain with you for 24 hours if you are discharged the same day.  Contacts, glasses, hearing aids, dentures or bridgework may not be worn into surgery.   Leave your suitcase in the car.  After surgery it may be brought to your room.  For patients admitted to the hospital, discharge time will be determined by your treatment team.  Patients  discharged the day of surgery will not be allowed to drive home.   Special instructions:   [place]- Preparing For Surgery  Before surgery, you can play an important role. Because skin is not sterile, your skin needs to be as free of germs as possible. You can reduce the number of germs on your skin by washing with CHG (chlorahexidine gluconate) Soap before surgery.  CHG is an antiseptic cleaner which kills germs and bonds with the skin to continue killing germs even after washing.    Oral Hygiene is also important to reduce your risk of infection.  Remember - BRUSH YOUR TEETH THE MORNING OF SURGERY WITH YOUR REGULAR TOOTHPASTE  Please do not use if you have an allergy to CHG or antibacterial soaps. If your skin becomes reddened/irritated stop using the CHG.  Do not shave (including legs and underarms) for at least 48 hours prior to first CHG shower. It is OK to shave your face.  Please follow these instructions carefully.   1. Shower the NIGHT BEFORE SURGERY and the MORNING OF SURGERY with CHG Soap.   2. If you chose to wash your hair, wash your hair first as usual with your normal shampoo.  3. After you shampoo, rinse your hair and body thoroughly to remove the shampoo.  4. Use CHG as you would any other liquid soap. You can apply CHG directly to the skin and wash gently with a scrungie or a clean washcloth.   5. Apply  the CHG Soap to your body ONLY FROM THE NECK DOWN.  Do not use on open wounds or open sores. Avoid contact with your eyes, ears, mouth and genitals (private parts). Wash Face and genitals (private parts)  with your normal soap.   6. Wash thoroughly, paying special attention to the area where your surgery will be performed.  7. Thoroughly rinse your body with warm water from the neck down.  8. DO NOT shower/wash with your normal soap after using and rinsing off the CHG Soap.  9. Pat yourself dry with a CLEAN TOWEL.  10. Wear CLEAN PAJAMAS to bed the night before  surgery, wear comfortable clothes the morning of surgery  11. Place CLEAN SHEETS on your bed the night of your first shower and DO NOT SLEEP WITH PETS.  Day of Surgery: Do not apply any deodorants/lotions. Please shower the morning of surgery with the CHG soap  Please wear clean clothes to the hospital/surgery center.   Remember to brush your teeth WITH YOUR REGULAR TOOTHPASTE.  Please read over the following fact sheets that you were given.

## 2019-08-19 ENCOUNTER — Encounter (HOSPITAL_COMMUNITY): Payer: Self-pay

## 2019-08-19 ENCOUNTER — Encounter (HOSPITAL_COMMUNITY)
Admission: RE | Admit: 2019-08-19 | Discharge: 2019-08-19 | Disposition: A | Discharge status: Home / Self Care | Payer: PRIVATE HEALTH INSURANCE | Source: Ambulatory Visit | Attending: Obstetrics and Gynecology | Admitting: Obstetrics and Gynecology

## 2019-08-19 ENCOUNTER — Other Ambulatory Visit: Payer: Self-pay

## 2019-08-19 ENCOUNTER — Other Ambulatory Visit (HOSPITAL_COMMUNITY)
Admission: RE | Admit: 2019-08-19 | Discharge: 2019-08-19 | Disposition: A | Discharge status: Home / Self Care | Payer: PRIVATE HEALTH INSURANCE | Source: Ambulatory Visit | Attending: Obstetrics and Gynecology | Admitting: Obstetrics and Gynecology

## 2019-08-19 DIAGNOSIS — Z01812 Encounter for preprocedural laboratory examination: Secondary | ICD-10-CM | POA: Insufficient documentation

## 2019-08-19 DIAGNOSIS — Z20828 Contact with and (suspected) exposure to other viral communicable diseases: Secondary | ICD-10-CM | POA: Insufficient documentation

## 2019-08-19 LAB — CBC
HCT: 35.4 % — ABNORMAL LOW (ref 36.0–46.0)
Hemoglobin: 10.3 g/dL — ABNORMAL LOW (ref 12.0–15.0)
MCH: 23.2 pg — ABNORMAL LOW (ref 26.0–34.0)
MCHC: 29.1 g/dL — ABNORMAL LOW (ref 30.0–36.0)
MCV: 79.7 fL — ABNORMAL LOW (ref 80.0–100.0)
Platelets: 386 10*3/uL (ref 150–400)
RBC: 4.44 MIL/uL (ref 3.87–5.11)
RDW: 19.1 % — ABNORMAL HIGH (ref 11.5–15.5)
WBC: 7.4 10*3/uL (ref 4.0–10.5)
nRBC: 0 % (ref 0.0–0.2)

## 2019-08-19 LAB — BASIC METABOLIC PANEL
Anion gap: 12 (ref 5–15)
BUN: 9 mg/dL (ref 6–20)
CO2: 26 mmol/L (ref 22–32)
Calcium: 9.1 mg/dL (ref 8.9–10.3)
Chloride: 99 mmol/L (ref 98–111)
Creatinine, Ser: 1.19 mg/dL — ABNORMAL HIGH (ref 0.44–1.00)
GFR calc Af Amer: >60 mL/min (ref >60)
GFR calc non Af Amer: 57 mL/min — ABNORMAL LOW (ref >60)
Glucose, Bld: 101 mg/dL — ABNORMAL HIGH (ref 70–99)
Potassium: 3.4 mmol/L — ABNORMAL LOW (ref 3.5–5.1)
Sodium: 137 mmol/L (ref 135–145)

## 2019-08-19 LAB — TYPE AND SCREEN
ABO/RH(D): O POS
Antibody Screen: NEGATIVE

## 2019-08-19 LAB — ABO/RH: ABO/RH(D): O POS

## 2019-08-19 LAB — SARS CORONAVIRUS 2 (TAT 6-24 HRS): SARS Coronavirus 2: NEGATIVE

## 2019-08-19 NOTE — Progress Notes (Addendum)
Scheduled for Covid test today. No h/o recent travel. No h/o recent exposure to Covid positive patients.No sx of Covid at PAT visit.  PCP - Millsaps, Kimberly,NP  Cardiologist -denies  Nephrologist-Dr Vista Mink   Chest x-ray - NA  EKG - 03-24-19  Stress Test - denies  ECHO - denies  Cardiac Cath - denies  AICD-denies PM-denies LOOP-denies Sleep Study -  CPAP -   LABS-CBC,BMP,T/S  ASA-denies  ERAS-NA  HA1C-denies Fasting Blood Sugar -  Checks Blood Sugar _____ times a day  Anesthesia-Y. Received medical clearance  Pt denies having chest pain, sob, or fever at this time. All instructions explained to the pt, with a verbal understanding of the material. Pt agrees to go over the instructions while at home for a better understanding. Pt also instructed to self quarantine after being tested for COVID-19. The opportunity to ask questions was provided.

## 2019-08-20 MED ORDER — GENTAMICIN SULFATE 40 MG/ML IJ SOLN
5.0000 mg/kg | INTRAVENOUS | Status: AC
  Administered 2019-08-21: 600 mg via INTRAVENOUS
  Filled 2019-08-20 (×2): qty 15

## 2019-08-20 MED ORDER — CLINDAMYCIN PHOSPHATE 900 MG/50ML IV SOLN
900.0000 mg | INTRAVENOUS | Status: AC
  Administered 2019-08-21 (×2): 900 mg via INTRAVENOUS
  Filled 2019-08-20: qty 50

## 2019-08-20 MED ORDER — CLINDAMYCIN PHOSPHATE 900 MG/50ML IV SOLN: 900.0000 mg | INTRAVENOUS | Status: DC

## 2019-08-20 MED ORDER — GENTAMICIN SULFATE 40 MG/ML IJ SOLN
5.0000 mg/kg | INTRAVENOUS | Status: DC
  Filled 2019-08-20: qty 15

## 2019-08-20 NOTE — Anesthesia Preprocedure Evaluation (Addendum)
Anesthesia Evaluation  Patient identified by MRN, date of birth, ID band Patient awake    Reviewed: Allergy & Precautions, NPO status , Patient's Chart, lab work & pertinent test results  Airway Mallampati: I  TM Distance: >3 FB Neck ROM: Full    Dental no notable dental hx. (+) Teeth Intact, Dental Advisory Given   Pulmonary neg pulmonary ROS,    Pulmonary exam normal breath sounds clear to auscultation       Cardiovascular hypertension, Pt. on medications negative cardio ROS Normal cardiovascular exam Rhythm:Regular Rate:Normal     Neuro/Psych PSYCHIATRIC DISORDERS Anxiety Depression negative neurological ROS     GI/Hepatic negative GI ROS, Neg liver ROS,   Endo/Other  Morbid obesity  Renal/GU Renal InsufficiencyRenal disease  negative genitourinary   Musculoskeletal negative musculoskeletal ROS (+)   Abdominal   Peds  Hematology  (+) Blood dyscrasia (Hgb 10.3), anemia ,   Anesthesia Other Findings   Reproductive/Obstetrics                          Anesthesia Physical Anesthesia Plan  ASA: III  Anesthesia Plan: General   Post-op Pain Management:    Induction: Intravenous  PONV Risk Score and Plan: Midazolam, Dexamethasone and Ondansetron  Airway Management Planned: Oral ETT  Additional Equipment:   Intra-op Plan:   Post-operative Plan: Extubation in OR  Informed Consent: I have reviewed the patients History and Physical, chart, labs and discussed the procedure including the risks, benefits and alternatives for the proposed anesthesia with the patient or authorized representative who has indicated his/her understanding and acceptance.     Dental advisory given  Plan Discussed with: CRNA  Anesthesia Plan Comments: (Follows with Dr. Moshe Cipro for hx of CKDIII and malignant HTN. Cleared for surgery per note 07/09/19, copy on pt chart. )       Anesthesia Quick  Evaluation

## 2019-08-21 ENCOUNTER — Encounter (HOSPITAL_COMMUNITY)
Admission: RE | Disposition: A | Discharge status: Home / Self Care | Payer: Self-pay | Source: Home / Self Care | Attending: Obstetrics and Gynecology

## 2019-08-21 ENCOUNTER — Ambulatory Visit (HOSPITAL_COMMUNITY): Payer: PRIVATE HEALTH INSURANCE | Admitting: Anesthesiology

## 2019-08-21 ENCOUNTER — Encounter (HOSPITAL_COMMUNITY): Payer: Self-pay | Admitting: Anesthesiology

## 2019-08-21 ENCOUNTER — Ambulatory Visit (HOSPITAL_COMMUNITY): Payer: PRIVATE HEALTH INSURANCE | Admitting: Physician Assistant

## 2019-08-21 ENCOUNTER — Other Ambulatory Visit: Payer: Self-pay

## 2019-08-21 ENCOUNTER — Ambulatory Visit (HOSPITAL_COMMUNITY)
Admission: RE | Admit: 2019-08-21 | Discharge: 2019-08-22 | Disposition: A | Discharge status: Home / Self Care | Payer: PRIVATE HEALTH INSURANCE | Attending: Obstetrics and Gynecology | Admitting: Obstetrics and Gynecology

## 2019-08-21 DIAGNOSIS — D631 Anemia in chronic kidney disease: Secondary | ICD-10-CM | POA: Insufficient documentation

## 2019-08-21 DIAGNOSIS — F410 Panic disorder [episodic paroxysmal anxiety] without agoraphobia: Secondary | ICD-10-CM | POA: Diagnosis not present

## 2019-08-21 DIAGNOSIS — Z8249 Family history of ischemic heart disease and other diseases of the circulatory system: Secondary | ICD-10-CM | POA: Insufficient documentation

## 2019-08-21 DIAGNOSIS — Z79899 Other long term (current) drug therapy: Secondary | ICD-10-CM | POA: Insufficient documentation

## 2019-08-21 DIAGNOSIS — N838 Other noninflammatory disorders of ovary, fallopian tube and broad ligament: Secondary | ICD-10-CM | POA: Diagnosis not present

## 2019-08-21 DIAGNOSIS — N921 Excessive and frequent menstruation with irregular cycle: Secondary | ICD-10-CM | POA: Diagnosis not present

## 2019-08-21 DIAGNOSIS — Z885 Allergy status to narcotic agent status: Secondary | ICD-10-CM | POA: Insufficient documentation

## 2019-08-21 DIAGNOSIS — D259 Leiomyoma of uterus, unspecified: Secondary | ICD-10-CM | POA: Diagnosis present

## 2019-08-21 DIAGNOSIS — N920 Excessive and frequent menstruation with regular cycle: Secondary | ICD-10-CM

## 2019-08-21 DIAGNOSIS — N183 Chronic kidney disease, stage 3 unspecified: Secondary | ICD-10-CM | POA: Diagnosis not present

## 2019-08-21 DIAGNOSIS — Z90710 Acquired absence of both cervix and uterus: Secondary | ICD-10-CM | POA: Diagnosis present

## 2019-08-21 DIAGNOSIS — D219 Benign neoplasm of connective and other soft tissue, unspecified: Secondary | ICD-10-CM

## 2019-08-21 DIAGNOSIS — Z888 Allergy status to other drugs, medicaments and biological substances status: Secondary | ICD-10-CM | POA: Insufficient documentation

## 2019-08-21 DIAGNOSIS — N83202 Unspecified ovarian cyst, left side: Secondary | ICD-10-CM | POA: Diagnosis not present

## 2019-08-21 DIAGNOSIS — N939 Abnormal uterine and vaginal bleeding, unspecified: Secondary | ICD-10-CM | POA: Diagnosis present

## 2019-08-21 DIAGNOSIS — Z880 Allergy status to penicillin: Secondary | ICD-10-CM | POA: Insufficient documentation

## 2019-08-21 DIAGNOSIS — I129 Hypertensive chronic kidney disease with stage 1 through stage 4 chronic kidney disease, or unspecified chronic kidney disease: Secondary | ICD-10-CM | POA: Diagnosis not present

## 2019-08-21 DIAGNOSIS — D251 Intramural leiomyoma of uterus: Secondary | ICD-10-CM | POA: Diagnosis not present

## 2019-08-21 DIAGNOSIS — Z91040 Latex allergy status: Secondary | ICD-10-CM | POA: Insufficient documentation

## 2019-08-21 HISTORY — PX: CYSTOSCOPY: SHX5120

## 2019-08-21 HISTORY — PX: PROCTOSCOPY: SHX2266

## 2019-08-21 HISTORY — PX: LAPAROSCOPIC OVARIAN CYSTECTOMY: SHX6248

## 2019-08-21 HISTORY — PX: LAPAROSCOPY: SHX197

## 2019-08-21 HISTORY — PX: TOTAL LAPAROSCOPIC HYSTERECTOMY WITH SALPINGECTOMY: SHX6742

## 2019-08-21 LAB — POCT PREGNANCY, URINE: Preg Test, Ur: NEGATIVE

## 2019-08-21 SURGERY — HYSTERECTOMY, TOTAL, LAPAROSCOPIC, WITH SALPINGECTOMY
Anesthesia: General | Site: Rectum

## 2019-08-21 MED ORDER — ONDANSETRON HCL 4 MG/2ML IJ SOLN
INTRAMUSCULAR | Status: AC
  Filled 2019-08-21: qty 2

## 2019-08-21 MED ORDER — ACETAMINOPHEN 500 MG PO TABS
1000.0000 mg | ORAL_TABLET | Freq: Once | ORAL | Status: AC
  Administered 2019-08-21: 1000 mg via ORAL
  Filled 2019-08-21: qty 2

## 2019-08-21 MED ORDER — METOCLOPRAMIDE HCL 5 MG/ML IJ SOLN
10.0000 mg | Freq: Four times a day (QID) | INTRAMUSCULAR | Status: DC
  Administered 2019-08-22 (×2): 10 mg via INTRAVENOUS
  Filled 2019-08-21 (×2): qty 2

## 2019-08-21 MED ORDER — NALOXONE HCL 0.4 MG/ML IJ SOLN: 0.4000 mg | INTRAMUSCULAR | Status: DC | PRN

## 2019-08-21 MED ORDER — AMLODIPINE BESYLATE 5 MG PO TABS
5.0000 mg | ORAL_TABLET | Freq: Every day | ORAL | Status: DC
  Administered 2019-08-22: 5 mg via ORAL
  Filled 2019-08-21: qty 1

## 2019-08-21 MED ORDER — DIPHENHYDRAMINE HCL 50 MG/ML IJ SOLN: 12.5000 mg | Freq: Four times a day (QID) | INTRAMUSCULAR | Status: DC | PRN

## 2019-08-21 MED ORDER — ROCURONIUM BROMIDE 10 MG/ML (PF) SYRINGE
PREFILLED_SYRINGE | INTRAVENOUS | Status: AC
  Filled 2019-08-21: qty 10

## 2019-08-21 MED ORDER — LACTATED RINGERS IV SOLN
INTRAVENOUS | Status: DC
  Administered 2019-08-21 (×2): via INTRAVENOUS

## 2019-08-21 MED ORDER — KETOROLAC TROMETHAMINE 30 MG/ML IJ SOLN: 30.0000 mg | Freq: Once | INTRAMUSCULAR | Status: DC

## 2019-08-21 MED ORDER — LACTATED RINGERS IV SOLN
INTRAVENOUS | Status: DC | PRN
  Administered 2019-08-21 (×2): via INTRAVENOUS

## 2019-08-21 MED ORDER — DEXAMETHASONE SODIUM PHOSPHATE 10 MG/ML IJ SOLN
INTRAMUSCULAR | Status: AC
  Filled 2019-08-21: qty 1

## 2019-08-21 MED ORDER — MIDAZOLAM HCL 2 MG/2ML IJ SOLN
INTRAMUSCULAR | Status: AC
  Filled 2019-08-21: qty 2

## 2019-08-21 MED ORDER — FENTANYL CITRATE (PF) 250 MCG/5ML IJ SOLN
INTRAMUSCULAR | Status: AC
  Filled 2019-08-21: qty 5

## 2019-08-21 MED ORDER — LACTATED RINGERS IV SOLN: INTRAVENOUS | Status: DC | PRN

## 2019-08-21 MED ORDER — STERILE WATER FOR IRRIGATION IR SOLN
Status: DC | PRN
  Administered 2019-08-21: 1000 mL

## 2019-08-21 MED ORDER — IBUPROFEN 800 MG PO TABS: 800.0000 mg | ORAL_TABLET | Freq: Four times a day (QID) | ORAL | Status: DC

## 2019-08-21 MED ORDER — HYDROMORPHONE 1 MG/ML IV SOLN
INTRAVENOUS | Status: AC
  Filled 2019-08-21: qty 30

## 2019-08-21 MED ORDER — ESTRADIOL 0.1 MG/GM VA CREA
TOPICAL_CREAM | VAGINAL | Status: AC
  Filled 2019-08-21: qty 42.5

## 2019-08-21 MED ORDER — KETOROLAC TROMETHAMINE 30 MG/ML IJ SOLN
30.0000 mg | Freq: Four times a day (QID) | INTRAMUSCULAR | Status: AC
  Administered 2019-08-21 – 2019-08-22 (×4): 30 mg via INTRAVENOUS
  Filled 2019-08-21 (×4): qty 1

## 2019-08-21 MED ORDER — LIDOCAINE 2% (20 MG/ML) 5 ML SYRINGE
INTRAMUSCULAR | Status: AC
  Filled 2019-08-21: qty 5

## 2019-08-21 MED ORDER — PHENYLEPHRINE 40 MCG/ML (10ML) SYRINGE FOR IV PUSH (FOR BLOOD PRESSURE SUPPORT)
PREFILLED_SYRINGE | INTRAVENOUS | Status: DC | PRN
  Administered 2019-08-21: 120 ug via INTRAVENOUS
  Administered 2019-08-21 (×2): 80 ug via INTRAVENOUS
  Administered 2019-08-21: 40 ug via INTRAVENOUS
  Administered 2019-08-21: 80 ug via INTRAVENOUS
  Administered 2019-08-21: 280 ug via INTRAVENOUS

## 2019-08-21 MED ORDER — SODIUM CHLORIDE (PF) 0.9 % IJ SOLN
INTRAMUSCULAR | Status: AC
  Filled 2019-08-21: qty 50

## 2019-08-21 MED ORDER — FENTANYL CITRATE (PF) 100 MCG/2ML IJ SOLN
INTRAMUSCULAR | Status: DC | PRN
  Administered 2019-08-21 (×2): 50 ug via INTRAVENOUS
  Administered 2019-08-21: 100 ug via INTRAVENOUS
  Administered 2019-08-21: 50 ug via INTRAVENOUS

## 2019-08-21 MED ORDER — IRBESARTAN 150 MG PO TABS
150.0000 mg | ORAL_TABLET | Freq: Every day | ORAL | Status: DC
  Administered 2019-08-22: 150 mg via ORAL
  Filled 2019-08-21: qty 1

## 2019-08-21 MED ORDER — ROCURONIUM BROMIDE 10 MG/ML (PF) SYRINGE
PREFILLED_SYRINGE | INTRAVENOUS | Status: DC | PRN
  Administered 2019-08-21 (×2): 10 mg via INTRAVENOUS
  Administered 2019-08-21: 5 mg via INTRAVENOUS
  Administered 2019-08-21: 10 mg via INTRAVENOUS
  Administered 2019-08-21: 100 mg via INTRAVENOUS
  Administered 2019-08-21: 20 mg via INTRAVENOUS
  Administered 2019-08-21: 5 mg via INTRAVENOUS

## 2019-08-21 MED ORDER — ALPRAZOLAM 0.25 MG PO TABS: 0.5000 mg | ORAL_TABLET | ORAL | Status: DC | PRN

## 2019-08-21 MED ORDER — SODIUM CHLORIDE 0.9 % IR SOLN
Status: DC | PRN
  Administered 2019-08-21 (×2): 3000 mL

## 2019-08-21 MED ORDER — FENTANYL CITRATE (PF) 100 MCG/2ML IJ SOLN
25.0000 ug | INTRAMUSCULAR | Status: DC | PRN
  Administered 2019-08-21 (×3): 50 ug via INTRAVENOUS

## 2019-08-21 MED ORDER — ONDANSETRON HCL 4 MG/2ML IJ SOLN
INTRAMUSCULAR | Status: DC | PRN
  Administered 2019-08-21: 4 mg via INTRAVENOUS

## 2019-08-21 MED ORDER — CLONIDINE HCL 0.1 MG PO TABS: 0.1000 mg | ORAL_TABLET | Freq: Every day | ORAL | Status: DC | PRN

## 2019-08-21 MED ORDER — FENTANYL CITRATE (PF) 100 MCG/2ML IJ SOLN
INTRAMUSCULAR | Status: AC
  Filled 2019-08-21: qty 2

## 2019-08-21 MED ORDER — PHENYLEPHRINE 40 MCG/ML (10ML) SYRINGE FOR IV PUSH (FOR BLOOD PRESSURE SUPPORT)
PREFILLED_SYRINGE | INTRAVENOUS | Status: AC
  Filled 2019-08-21: qty 20

## 2019-08-21 MED ORDER — PROPOFOL 10 MG/ML IV BOLUS
INTRAVENOUS | Status: AC
  Filled 2019-08-21: qty 20

## 2019-08-21 MED ORDER — BUPIVACAINE HCL (PF) 0.25 % IJ SOLN
INTRAMUSCULAR | Status: DC | PRN
  Administered 2019-08-21: 21 mL

## 2019-08-21 MED ORDER — BUPIVACAINE HCL (PF) 0.25 % IJ SOLN
INTRAMUSCULAR | Status: AC
  Filled 2019-08-21: qty 30

## 2019-08-21 MED ORDER — KETOROLAC TROMETHAMINE 30 MG/ML IJ SOLN
INTRAMUSCULAR | Status: DC | PRN
  Administered 2019-08-21: 30 mg via INTRAVENOUS

## 2019-08-21 MED ORDER — KETOROLAC TROMETHAMINE 30 MG/ML IJ SOLN
INTRAMUSCULAR | Status: AC
  Filled 2019-08-21: qty 1

## 2019-08-21 MED ORDER — ONDANSETRON HCL 4 MG/2ML IJ SOLN
4.0000 mg | Freq: Four times a day (QID) | INTRAMUSCULAR | Status: DC | PRN
  Administered 2019-08-21: 4 mg via INTRAVENOUS
  Filled 2019-08-21: qty 2

## 2019-08-21 MED ORDER — DEXAMETHASONE SODIUM PHOSPHATE 10 MG/ML IJ SOLN
INTRAMUSCULAR | Status: DC | PRN
  Administered 2019-08-21: 10 mg via INTRAVENOUS

## 2019-08-21 MED ORDER — OXYCODONE HCL 5 MG PO TABS
5.0000 mg | ORAL_TABLET | Freq: Four times a day (QID) | ORAL | Status: DC | PRN
  Administered 2019-08-22: 5 mg via ORAL
  Filled 2019-08-21: qty 1

## 2019-08-21 MED ORDER — ALPRAZOLAM 0.5 MG PO TABS
0.5000 mg | ORAL_TABLET | ORAL | Status: AC
  Administered 2019-08-21: 0.5 mg via ORAL
  Filled 2019-08-21: qty 1

## 2019-08-21 MED ORDER — PHENYLEPHRINE HCL-NACL 10-0.9 MG/250ML-% IV SOLN
INTRAVENOUS | Status: DC | PRN
  Administered 2019-08-21: 20 ug/min via INTRAVENOUS

## 2019-08-21 MED ORDER — FUROSEMIDE 40 MG PO TABS
40.0000 mg | ORAL_TABLET | Freq: Two times a day (BID) | ORAL | Status: DC
  Administered 2019-08-22: 40 mg via ORAL
  Filled 2019-08-21: qty 1

## 2019-08-21 MED ORDER — MIDAZOLAM HCL 5 MG/5ML IJ SOLN
INTRAMUSCULAR | Status: DC | PRN
  Administered 2019-08-21: 2 mg via INTRAVENOUS

## 2019-08-21 MED ORDER — VASOPRESSIN 20 UNIT/ML IV SOLN
INTRAVENOUS | Status: AC
  Filled 2019-08-21: qty 1

## 2019-08-21 MED ORDER — CHLORHEXIDINE GLUCONATE CLOTH 2 % EX PADS: 6.0000 | MEDICATED_PAD | Freq: Every day | CUTANEOUS | Status: DC

## 2019-08-21 MED ORDER — DIPHENHYDRAMINE HCL 12.5 MG/5ML PO ELIX: 12.5000 mg | ORAL_SOLUTION | Freq: Four times a day (QID) | ORAL | Status: DC | PRN

## 2019-08-21 MED ORDER — SUGAMMADEX SODIUM 200 MG/2ML IV SOLN
INTRAVENOUS | Status: DC | PRN
  Administered 2019-08-21: 300 mg via INTRAVENOUS

## 2019-08-21 MED ORDER — ALUM & MAG HYDROXIDE-SIMETH 200-200-20 MG/5ML PO SUSP
30.0000 mL | ORAL | Status: DC | PRN
  Administered 2019-08-22: 30 mL via ORAL
  Filled 2019-08-21: qty 30

## 2019-08-21 MED ORDER — SODIUM CHLORIDE 0.9% FLUSH: 9.0000 mL | INTRAVENOUS | Status: DC | PRN

## 2019-08-21 MED ORDER — LIDOCAINE 2% (20 MG/ML) 5 ML SYRINGE
INTRAMUSCULAR | Status: DC | PRN
  Administered 2019-08-21: 100 mg via INTRAVENOUS

## 2019-08-21 MED ORDER — PROPOFOL 10 MG/ML IV BOLUS
INTRAVENOUS | Status: DC | PRN
  Administered 2019-08-21: 200 mg via INTRAVENOUS

## 2019-08-21 MED ORDER — HYDROMORPHONE 1 MG/ML IV SOLN
INTRAVENOUS | Status: DC
  Administered 2019-08-21: 30 mg via INTRAVENOUS
  Administered 2019-08-22 (×2): 0.2 mg via INTRAVENOUS
  Administered 2019-08-22: 1.2 mg via INTRAVENOUS
  Filled 2019-08-21: qty 30

## 2019-08-21 MED ORDER — ACETAMINOPHEN 10 MG/ML IV SOLN
INTRAVENOUS | Status: DC | PRN
  Administered 2019-08-21: 1000 mg via INTRAVENOUS

## 2019-08-21 MED ORDER — ENOXAPARIN SODIUM 40 MG/0.4ML ~~LOC~~ SOLN
40.0000 mg | SUBCUTANEOUS | Status: DC
  Administered 2019-08-22: 40 mg via SUBCUTANEOUS
  Filled 2019-08-21: qty 0.4

## 2019-08-21 SURGICAL SUPPLY — 104 items
ADH SKN CLS APL DERMABOND .7 (GAUZE/BANDAGES/DRESSINGS) ×6
APL SRG 38 LTWT LNG FL B (MISCELLANEOUS) ×6
APPLICATOR ARISTA FLEXITIP XL (MISCELLANEOUS) ×1 IMPLANT
BAG SPEC RTRVL LRG 6X4 10 (ENDOMECHANICALS)
BARRIER ADHS 3X4 INTERCEED (GAUZE/BANDAGES/DRESSINGS) IMPLANT
BRR ADH 4X3 ABS CNTRL BYND (GAUZE/BANDAGES/DRESSINGS)
CABLE HIGH FREQUENCY MONO STRZ (ELECTRODE) IMPLANT
CANISTER SUCT 3000ML PPV (MISCELLANEOUS) ×7 IMPLANT
CATH FOLEY LATEX FREE 16FR (CATHETERS) ×7
CATH FOLEY LF 16FR (CATHETERS) IMPLANT
CATH ROBINSON RED A/P 16FR (CATHETERS) ×1 IMPLANT
COVER BACK TABLE 60X90IN (DRAPES) ×6 IMPLANT
COVER MAYO STAND STRL (DRAPES) ×7 IMPLANT
COVER WAND RF STERILE (DRAPES) ×7 IMPLANT
DECANTER SPIKE VIAL GLASS SM (MISCELLANEOUS) IMPLANT
DEFOGGER SCOPE WARMER CLEARIFY (MISCELLANEOUS) ×1 IMPLANT
DERMABOND ADVANCED (GAUZE/BANDAGES/DRESSINGS) ×1
DERMABOND ADVANCED .7 DNX12 (GAUZE/BANDAGES/DRESSINGS) ×6 IMPLANT
DISSECTOR BLUNT TIP ENDO 5MM (MISCELLANEOUS) ×1 IMPLANT
DRAPE WARM FLUID 44X44 (DRAPES) IMPLANT
DRSG OPSITE POSTOP 3X4 (GAUZE/BANDAGES/DRESSINGS) ×7 IMPLANT
DRSG OPSITE POSTOP 4X10 (GAUZE/BANDAGES/DRESSINGS) ×6 IMPLANT
DURAPREP 26ML APPLICATOR (WOUND CARE) ×7 IMPLANT
ELECT REM PT RETURN 9FT ADLT (ELECTROSURGICAL) ×7
ELECTRODE REM PT RTRN 9FT ADLT (ELECTROSURGICAL) ×6 IMPLANT
FILTER SMOKE EVAC LAPAROSHD (FILTER) ×6 IMPLANT
FORCEPS CUTTING 33CM 5MM (CUTTING FORCEPS) ×6 IMPLANT
GAUZE 4X4 16PLY RFD (DISPOSABLE) ×9 IMPLANT
GAUZE PACKING 2X5 YD STRL (GAUZE/BANDAGES/DRESSINGS) IMPLANT
GAUZE SPONGE 4X4 16PLY XRAY LF (GAUZE/BANDAGES/DRESSINGS) ×7 IMPLANT
GLOVE BIO SURGEON STRL SZ7.5 (GLOVE) ×14 IMPLANT
GLOVE BIOGEL PI IND STRL 7.0 (GLOVE) ×18 IMPLANT
GLOVE BIOGEL PI IND STRL 7.5 (GLOVE) ×18 IMPLANT
GLOVE BIOGEL PI INDICATOR 7.0 (GLOVE) ×3
GLOVE BIOGEL PI INDICATOR 7.5 (GLOVE) ×3
GOWN STRL REUS W/ TWL LRG LVL3 (GOWN DISPOSABLE) ×18 IMPLANT
GOWN STRL REUS W/TWL LRG LVL3 (GOWN DISPOSABLE) ×21
HEMOSTAT ARISTA ABSORB 3G PWDR (HEMOSTASIS) ×1 IMPLANT
HEMOSTAT SURGICEL 2X14 (HEMOSTASIS) IMPLANT
HEMOSTAT SURGICEL 4X8 (HEMOSTASIS) IMPLANT
HIBICLENS CHG 4% 4OZ BTL (MISCELLANEOUS) ×7 IMPLANT
KIT TURNOVER KIT B (KITS) ×7 IMPLANT
LEGGING LITHOTOMY PAIR STRL (DRAPES) ×6 IMPLANT
NDL INSUFFLATION 14GA 120MM (NEEDLE) ×6 IMPLANT
NDL MAYO CATGUT SZ4 TPR NDL (NEEDLE) IMPLANT
NEEDLE HYPO 22GX1.5 SAFETY (NEEDLE) IMPLANT
NEEDLE INSUFFLATION 14GA 120MM (NEEDLE) ×7 IMPLANT
NEEDLE MAYO CATGUT SZ4 (NEEDLE) IMPLANT
NS IRRIG 1000ML POUR BTL (IV SOLUTION) ×7 IMPLANT
OCCLUDER COLPOPNEUMO (BALLOONS) ×7 IMPLANT
PACK ABDOMINAL GYN (CUSTOM PROCEDURE TRAY) ×7 IMPLANT
PACK LAPAROSCOPY BASIN (CUSTOM PROCEDURE TRAY) ×7 IMPLANT
PACK LAVH (CUSTOM PROCEDURE TRAY) ×7 IMPLANT
PACK ROBOTIC GOWN (GOWN DISPOSABLE) ×7 IMPLANT
PACK TRENDGUARD 450 HYBRID PRO (MISCELLANEOUS) IMPLANT
PAD ARMBOARD 7.5X6 YLW CONV (MISCELLANEOUS) ×7 IMPLANT
PAD OB MATERNITY 4.3X12.25 (PERSONAL CARE ITEMS) ×7 IMPLANT
POUCH SPECIMEN RETRIEVAL 10MM (ENDOMECHANICALS) IMPLANT
PROTECTOR NERVE ULNAR (MISCELLANEOUS) ×14 IMPLANT
SCISSORS LAP 5X35 DISP (ENDOMECHANICALS) ×6 IMPLANT
SET CYSTO W/LG BORE CLAMP LF (SET/KITS/TRAYS/PACK) ×7 IMPLANT
SET IRRIG TUBING LAPAROSCOPIC (IRRIGATION / IRRIGATOR) ×7 IMPLANT
SET TRI-LUMEN FLTR TB AIRSEAL (TUBING) IMPLANT
SET TUBE SMOKE EVAC HIGH FLOW (TUBING) ×7 IMPLANT
SHEARS HARMONIC ACE PLUS 36CM (ENDOMECHANICALS) ×7 IMPLANT
SHEET LAVH (DRAPES) ×6 IMPLANT
SLEEVE ENDOPATH XCEL 5M (ENDOMECHANICALS) ×7 IMPLANT
SOLUTION ELECTROLUBE (MISCELLANEOUS) IMPLANT
SPECIMEN JAR MEDIUM (MISCELLANEOUS) ×7 IMPLANT
SPONGE INTESTINAL PEANUT (DISPOSABLE) ×6 IMPLANT
SPONGE LAP 18X18 RF (DISPOSABLE) IMPLANT
SUT CHROMIC 2 0 CT 1 (SUTURE) ×7 IMPLANT
SUT CHROMIC 2 0 SH (SUTURE) IMPLANT
SUT MNCRL AB 3-0 PS2 27 (SUTURE) ×18 IMPLANT
SUT MON AB 3-0 SH 27 (SUTURE)
SUT MON AB 3-0 SH27 (SUTURE) IMPLANT
SUT MON AB 4-0 PS1 27 (SUTURE) ×7 IMPLANT
SUT PDS AB 1 CT1 36 (SUTURE) IMPLANT
SUT PDS AB 1 CTX 36 (SUTURE) IMPLANT
SUT PLAIN 2 0 XLH (SUTURE) ×7 IMPLANT
SUT VIC AB 0 CT1 18XCR BRD8 (SUTURE) ×12 IMPLANT
SUT VIC AB 0 CT1 27 (SUTURE) ×14
SUT VIC AB 0 CT1 27XBRD ANBCTR (SUTURE) ×12 IMPLANT
SUT VIC AB 0 CT1 36 (SUTURE) ×7 IMPLANT
SUT VIC AB 0 CT1 8-18 (SUTURE) ×14
SUT VICRYL 0 ENDOLOOP (SUTURE) IMPLANT
SUT VICRYL 0 TIES 12 18 (SUTURE) ×6 IMPLANT
SUT VICRYL 0 UR6 27IN ABS (SUTURE) ×15 IMPLANT
SYR 50ML LL SCALE MARK (SYRINGE) ×7 IMPLANT
SYR CONTROL 10ML LL (SYRINGE) IMPLANT
TIP UTERINE 5.1X6CM LAV DISP (MISCELLANEOUS) IMPLANT
TIP UTERINE 6.7X10CM GRN DISP (MISCELLANEOUS) IMPLANT
TIP UTERINE 6.7X6CM WHT DISP (MISCELLANEOUS) ×1 IMPLANT
TIP UTERINE 6.7X8CM BLUE DISP (MISCELLANEOUS) IMPLANT
TOWEL GREEN STERILE FF (TOWEL DISPOSABLE) ×14 IMPLANT
TRAY FOLEY W/BAG SLVR 14FR (SET/KITS/TRAYS/PACK) ×6 IMPLANT
TRAY PROCTOSCOPIC FIBER OPTIC (SET/KITS/TRAYS/PACK) ×1 IMPLANT
TRENDGUARD 450 HYBRID PRO PACK (MISCELLANEOUS) ×7
TROCAR BALLN 12MMX100 BLUNT (TROCAR) IMPLANT
TROCAR PORT AIRSEAL 5X120 (TROCAR) IMPLANT
TROCAR XCEL NON-BLD 11X100MML (ENDOMECHANICALS) ×15 IMPLANT
TROCAR XCEL NON-BLD 5MMX100MML (ENDOMECHANICALS) ×7 IMPLANT
UNDERPAD 30X30 (UNDERPADS AND DIAPERS) ×7 IMPLANT
WARMER LAPAROSCOPE (MISCELLANEOUS) ×7 IMPLANT

## 2019-08-21 NOTE — Op Note (Addendum)
Preop Diagnosis: Uterine Fibroids, Abnormal Uterine Bleeding   Postop Diagnosis: uterine fibroid, abnormal uterine bleeding   Procedure: 1.TOTAL LAPAROSCOPIC HYSTERECTOMY WITH BILATERAL SALPINGECTOMY 2.LAPAROSCOPIC OVARIAN CYSTECTOMY 3.CYSTOSCOPY 4.EVALUATION OF COLON and RECTUM BY DR. WHITE (SEE GENERAL SURGERY NOTE)    Anesthesia: General   Anesthesiologist: Myrtie Soman, MD   Attending: Everett Graff, MD   Assistant: Crawford Givens, MD  Findings: Large about 10cm left ovarian cyst, fibroid uterus, right ovary wnl. Right filshie clip removed.  Left one not visualized and not on tube.  It may have been in an adhesions adjacent to the bladder on the left but this was not confirmed.  Pathology: Uterus, Cervix and Portions of Bilateral Fallopian Tubes  Fluids: 2.5L  UOP: 700cc  EBL: 010UV  Complications: See general surgery note  Procedure: The patient was taken to the operating room, placed under general anesthesia and prepped and draped in the normal sterile fashion. A Foley catheter was placed in the bladder. The uterus sounded to 8cm.  A weighted speculum and vaginal retractors were placed in the vagina.  Tenaculum was placed on the anterior lip of the cervix.  A size 6 cm tip was used and the rumi was placed with 3.5cm KOH ring, tip balloon and occluder insufflated. Attention was then turned to the abdomen. A 10 mm infraumbilical incision was made with the scalpel after 5 cc of 0.25% percent Marcaine was used for local anesthesia. The Veress needle was placed in the intra-abdominal cavity and insufflation obtained.  A 10 mm trochar was advanced into the intra-abdominal cavity and the laparoscope was introduced.  Two 5 mm trochars were placed in the right and left lower quadrants under direct visualization with the laparoscope and a 10 mm trochar was placed in the suprapubic area under direct visualization as well. Anterior wall adhesions were noted which were excised with the  harmonic.  The harmonic was used to incise over the cyst and it was dissected out and sent to pathology.  The ovarian bed was cauterized for hemostasis. Visualization on the monitors was challenging throughout the entire case. Attention was then turned to the round ligament on the right and the utero-ovarian on that side as well and they were cauterized and cut with the harmonic.  Many adhesions were noted to the bladder which were dissected and cut away with the harmonic.  The tissue was incised anteriorly through to the KOH ring which was then circumscribed clockwise around the posterior aspect incorporating the uterine artery on the right which was cauterized and cut with the harmonic scalpel and the same done on the contralateral side.  The harmonic was then used to circumscribe the [facility] ring counter clockwise until the uterus and cervix were free.  The uterus was then pulled into the vagina.  There was a question of a bowel injury at that point in time. General surgery was consulted, please see Dr. Orest Dikes note.  No injury to the actual bowel was noted. The vaginal cuff was repaired through the vagina with interrupted stitches of zero vicryl.  All long instruments were used as the cuff was deep in the vagina.  The bowel was too insufflated for adequate visualization abdominally for laparoscopic repair of the cuff. Cystoscopy was performed and both ureters were seen to efflux without difficulty. The bladder had full integrity with no suture or laceration visualized.  Attention was then turned back to the abdomen after regowning and gloving. The abdomen was reinsufflated with CO2 gas.   The abdomen and  pelvis was copiously irrigated and  hemostasis was noted.  All trochars were removed under direct visualization using the laparoscope.  The two 60mm fascial incisions were reapproximated with 0 vicryl. The two 10 mm incisions were closed with 3-0 Monocryl via a subcuticular stitch.  All remaining skin incisions  were closed with Dermabond and the 10 mm skin incisions were reinforced using Dermabond.  Sponge lap and needle counts were correct.  The patient tolerated the procedure well and was returned to the PACU in stable condition.

## 2019-08-21 NOTE — Transfer of Care (Signed)
Immediate Anesthesia Transfer of Care Note  Patient: EMMALYNE GIACOMO  Procedure(s) Performed: TOTAL LAPAROSCOPIC HYSTERECTOMY WITH BILATERAL SALPINGECTOMY (Bilateral Abdomen) LAPAROSCOPIC OVARIAN CYSTECTOMY (Left Abdomen) Laparoscopy Diagnostic (N/A Abdomen) Rigid Proctoscopy (N/A Rectum)  Patient Location: PACU  Anesthesia Type:General  Level of Consciousness: awake, alert , oriented and patient cooperative  Airway & Oxygen Therapy: Patient Spontanous Breathing and Patient connected to face mask oxygen  Post-op Assessment: Report given to RN and Post -op Vital signs reviewed and stable  Post vital signs: Reviewed and stable  Last Vitals:  Vitals Value Taken Time  BP 127/75 08/21/19 1747  Temp    Pulse 88 08/21/19 1752  Resp 11 08/21/19 1752  SpO2 100 % 08/21/19 1752  Vitals shown include unvalidated device data.  Last Pain:  Vitals:   08/21/19 0917  PainSc: 0-No pain         Complications: No apparent anesthesia complications

## 2019-08-21 NOTE — Op Note (Signed)
08/21/2019  3:57 PM  PATIENT:  Elizabeth Guerra  43 y.o. female  Patient Care Team: Everardo Beals, NP as PCP - General  PRE-OPERATIVE DIAGNOSIS: Uterine fibroids/left ovarian cyst  POST-OPERATIVE DIAGNOSIS:  Same  PROCEDURE:   1. Diagnostic laparoscopy 2. Rigid proctoscopy  SURGEON:  Sharon Mt. Dema Severin, MD  CO-SURGEON: Everett Graff, MD  ANESTHESIA:   general  EBL: 0 mL for my portion of the procedure  DRAINS: None  SPECIMEN: None  COMPLICATIONS: None  FINDINGS: I was called intraoperatively to assess the rectosigmoid mesocolon. She had a partial injury to the peritoneum and superficial fat of the rectosigmoid mesocolon. I evaluated the cecum, appendix and sigmoid colon as it entered her pelvis. The concern was regarding the fat associated with the rectosigmoid colon.  There is no apparent serosal injury to the rectosigmoid colon and certainly no obvious full-thickness injury.  I was able to visualize the rectum to the level of the vaginal cuff.  There is no concern expressed for any injury distal to this.  The sigmoid was then gently occluded.  Rigid proctoscopy was then performed with the pelvis filled with water and there was no evident air leak.  DISPOSITION: PACU in satisfactory condition  DESCRIPTION: I was called for intraoperative evaluation of the rectosigmoid colon.  On arrival, the gynecology team was nearing completion of the their portion of the procedure with the exception of vaginal cuff closure.  I then scrubbed.  There is an area of concern had been lysed during adhesiolysis early on the case that was on the rectosigmoid mesocolon.  There was a defect in the associated peritoneum overlying the segment of mesocolon as well as exposed fat.  There was no visible vessels or evidence of vascular injury to the rectosigmoid colon.  The colon was pink in appearance.  The serosa of the sigmoid and proximal rectum was carefully evaluated. There was no apparent  deserosalization of this segment of bowel. There was no evident full thickness injury. The rectum was evaluated to the level of the vaginal cuff. This was normal in appearance without injury. The cecum and appendix were identified in the right lower quadrant and normal in appearance. The sigmoid colon well proximal to the laceration to the mesocolon was gently occluded with an atraumatic grasper.  Rigid proctoscopy.  Digital rectal exam was performed and normal.  The rigid proctoscope was inserted and advanced into the midportion of the rectum under direct visualization.  There is no evident injury and no major stool present.  At the level of the proximal rectum the mucosa was normal and pink and there was no blood present.  Air was insufflated and the pelvis had been filled with irrigation. There were no air bubbles to suggest air leak. The proctoscope was removed and a red rubber catheter placed in the rectum to help desufflate the colon. The grasper was removed. I turned the case back over to Dr. Mancel Bale and her team.

## 2019-08-21 NOTE — Anesthesia Procedure Notes (Signed)
Procedure Name: Intubation Date/Time: 08/21/2019 10:03 AM Performed by: Jenne Campus, CRNA Pre-anesthesia Checklist: Patient identified, Emergency Drugs available, Suction available and Patient being monitored Patient Re-evaluated:Patient Re-evaluated prior to induction Oxygen Delivery Method: Circle System Utilized Preoxygenation: Pre-oxygenation with 100% oxygen Induction Type: IV induction Ventilation: Mask ventilation without difficulty Laryngoscope Size: Miller and 2 Grade View: Grade I Tube type: Oral Tube size: 7.0 mm Number of attempts: 1 Airway Equipment and Method: Stylet and Oral airway Placement Confirmation: ETT inserted through vocal cords under direct vision,  positive ETCO2 and breath sounds checked- equal and bilateral Secured at: 21 cm Tube secured with: Tape Dental Injury: Teeth and Oropharynx as per pre-operative assessment

## 2019-08-21 NOTE — OR Nursing (Addendum)
Dr. Nadeen Landau general surgery paged to [facility] 7 at 1505. Arrived in OR at 1515.

## 2019-08-21 NOTE — Progress Notes (Signed)
Pt requesting clears Pt feels "gassy" Reports rt hand feeling cold Left shoulder pain she reported in PACU is better UOP 50cc in bag VSS RN report minimal BS but present Clears now CBC and BMET in the morning SCDs to bilateral LEs (machine started now because there was not one on the floor initially) Will start reglan for help with bowel function. Pt s/p retrograde air in rectum likely exacerbating sxs. Encourage IS Lovenox in the morning Pt does not want to get stuck but currently agreeable

## 2019-08-22 ENCOUNTER — Encounter (HOSPITAL_COMMUNITY): Payer: Self-pay | Admitting: Obstetrics and Gynecology

## 2019-08-22 DIAGNOSIS — D251 Intramural leiomyoma of uterus: Secondary | ICD-10-CM | POA: Diagnosis not present

## 2019-08-22 MED ORDER — IBUPROFEN 800 MG PO TABS: 800.0000 mg | ORAL_TABLET | Freq: Three times a day (TID) | ORAL | 1 refills | Status: DC | PRN

## 2019-08-22 MED ORDER — OXYCODONE HCL 5 MG PO TABS: 5.0000 mg | ORAL_TABLET | Freq: Four times a day (QID) | ORAL | 0 refills | Status: DC | PRN

## 2019-08-22 NOTE — Progress Notes (Addendum)
Elizabeth Guerra is a66 y.o.  [id number]  Post Op Date # 1: TLH/BS  Subjective: Patient is Postoperative course complicated by complaints of gas pains in epigastric area.  Patient has Pain is controlled with current analgesics. Medications being used: prescription NSAID's including Ketorolac 30 mg IV and narcotic analgesics including PCA Dilaudid. Ambulating in the halls without difficulty. Has only had liquids because her gas discomfort causes her not to have an appetite.  Foley remains in place with 100 cc of clear yellow urine  Objective: Vital signs in last 24 hours: Temp:  [97 F (36.1 C)-98.8 F (37.1 C)] 98.8 F (37.1 C) (10/30 0639) Pulse Rate:  [78-106] 85 (10/30 0639) Resp:  [10-22] 20 (10/30 0639) BP: (118-145)/(70-85) 142/79 (10/30 0639) SpO2:  [97 %-100 %] 98 % (10/30 0639) Weight:  [125.3 kg] 125.3 kg (10/29 1904)  Intake/Output from previous day: 10/29 0701 - 10/30 0700 In: 2770 [P.O.:240; I.V.:2530] Out: 1450 [Urine:1150] Intake/Output this shift: No intake/output data recorded. Recent Labs  Lab 08/19/19 1351  WBC 7.4  HGB 10.3*  HCT 35.4*  PLT 386     Recent Labs  Lab 08/19/19 1351  NA 137  K 3.4*  CL 99  CO2 26  BUN 9  CREATININE 1.19*  CALCIUM 9.1  GLUCOSE 101*    EXAM: General: alert, cooperative and no distress Resp: clear to auscultation bilaterally Cardio: regular rate and rhythm, S1, S2 normal, no murmur, click, rub or gallop GI: Decreased bowel sounds, tenderness without guarding in epigastric area but not lower quadrants; incsions are intact without eviidence of infection. Extremities: Homans sign is negative, no sign of DVT and no calf tenderness.   Assessment: s/p Procedure(s): TOTAL LAPAROSCOPIC HYSTERECTOMY WITH BILATERAL SALPINGECTOMY LAPAROSCOPIC OVARIAN CYSTECTOMY Laparoscopy Diagnostic Rigid Proctoscopy Cystoscopy   Plan: Advance diet Encourage ambulation Advance to PO medication Patient requests heating pad for  upper abdomen  Per Dr. Mancel Bale  LOS: 0 days    Elizabeth Regal, PA-C 08/22/2019 7:49 AM  Pt seen now.  Improving well.  Reports some tingling in rt hand but left shoulder is better.  Hands are still swollen.  Recs discussed.  Precautions reviewed.  If numb and persists, call office.  Once swelling decreases, call office for lab appt to have CBC and BMET done.  Labs unable to be drawn today d/t swelling.  No vaginal bleeding, + flatus, NABS, ambulating without difficulty.  S/p lovenox this morning and IS encouraged.  Discharge instructions reviewed.  Questions answered from her and her sister.

## 2019-08-22 NOTE — Discharge Instructions (Signed)
Call [facility] @ [phone] if:  You have a temperature greater than or equal to 100.4 degrees Farenheit orally You have pain that is not made better by the pain medication given and taken as directed You have excessive bleeding or problems urinating  Take Colace (Docusate Sodium/Stool Softener) 100 mg 2-3 times daily while taking narcotic pain medicine to avoid constipation or until bowel movements are regular. Take Ibuprofen 600 mg with food every 6 hours for 5 days then as needed for pain Take over the counter iron supplement of your choice, twice a day for the next 6 months while increasing your iron rich foods  You may drive after 2 weeks You may walk up steps  You may shower  You may resume a regular diet  Keep incisions clean and dry Do not lift over 15 pounds for 6 weeks Avoid anything in vagina for 6 weeks

## 2019-08-22 NOTE — Discharge Summary (Signed)
Physician Discharge Summary  Patient ID: Elizabeth Guerra MRN: [id number] DOB/AGE: 04/14/1976 43 y.o.  Admit date: 08/21/2019 Discharge date: 08/22/2019  Admission Diagnoses: Symptomatic Fibroids Ovarian Cyst  Discharge Diagnoses:  Active Problems:   Menorrhagia   Fibroids   Leiomyoma of uterus   S/P laparoscopic hysterectomy and cystectomy   Discharged Condition: stable  Hospital Course: POD 1 improving well.  Resumes nl bodily function routinely.  Discharge instructions reviewed.  Rec schedule lab visit in office once swelling has decreased for CBC and BMET.  Consults: general surgery intraop see Dr. Orest Dikes note  Significant Diagnostic Studies: n/a  Treatments: surgery: TLH/BS/Cystectomy/Cystoscopy and GS procedure for intraop eval  Discharge Exam: Blood pressure (!) 156/94, pulse 80, temperature 98.8 F (37.1 C), temperature source Oral, resp. rate 18, height 5\' 4"  (1.626 m), weight 125.3 kg, last menstrual period 08/06/2019, SpO2 99 %. Resp: clear to auscultation bilaterally Cardio: regular rate and rhythm GI: soft, appropriately tender, ND, no rebound or guarding, NABS, incisions c/d/intact with dermabond Extremities: no calf tenderness Incision/Wound: c/d/intact with dermabond  No VB  Disposition: Discharge disposition: 01-Home or Self Care        Allergies as of 08/22/2019      Reactions   Adhesive [tape] Other (See Comments)   welps   Latex Itching   Morphine And Related    itching   Amoxicillin Nausea And Vomiting   HEADACHES Did it involve swelling of the face/tongue/throat, SOB, or low BP? No Did it involve sudden or severe rash/hives, skin peeling, or any reaction on the inside of your mouth or nose? No Did you need to seek medical attention at a hospital or doctor's office? No When did it last happen? If all above answers are "NO", may proceed with cephalosporin use.      Medication List    STOP taking these medications   ferrous  sulfate 325 (65 FE) MG tablet   megestrol 40 MG tablet Commonly known as: MEGACE     TAKE these medications   amLODipine 5 MG tablet Commonly known as: NORVASC Take 5 mg by mouth 2 (two) times a day.   AZO TABS PO Take 2 tablets by mouth daily as needed (UTI).   cloNIDine 0.1 MG tablet Commonly known as: CATAPRES Take 0.1 mg by mouth daily as needed (blood pressure of 180/95 or above).   ibuprofen 800 MG tablet Commonly known as: ADVIL Take 1 tablet (800 mg total) by mouth every 8 (eight) hours as needed.   Lasix 40 MG tablet Generic drug: furosemide Take 40 mg by mouth 2 (two) times daily.   OVER THE COUNTER MEDICATION Take 2 capsules by mouth at bedtime. Probiotic supplement   oxyCODONE 5 MG immediate release tablet Commonly known as: Oxy IR/ROXICODONE Take 1-2 tablets (5-10 mg total) by mouth every 6 (six) hours as needed for moderate pain, severe pain or breakthrough pain.   Retin-A Micro Pump 0.06 % Gel Generic drug: Tretinoin Microsphere Apply 1 application topically at bedtime as needed (acne).   valsartan 160 MG tablet Commonly known as: DIOVAN Take 160 mg by mouth 2 (two) times daily.   vitamin C 1000 MG tablet Take 1,000 mg by mouth daily.      Follow-up Information    Everett Graff, MD Follow up on 09/29/2019.   Specialty: Obstetrics and Gynecology Why: appointment time is 11: 15 a.m. Contact information: [place] [id number] [phone]           Signed:  Delice Lesch 08/22/2019, 3:20 PM

## 2019-08-22 NOTE — Progress Notes (Signed)
Patient ID: Elizabeth Guerra, female   DOB: 10/16/1976, 43 y.o.   MRN: [id number]    1 Day Post-Op  Subjective: C/o some pain this morning as expected.  No nausea, just not much of an appetite.  ROS: See above, otherwise other systems negative  Objective: Vital signs in last 24 hours: Temp:  [97 F (36.1 C)-98.8 F (37.1 C)] 98.8 F (37.1 C) (10/30 0639) Pulse Rate:  [78-93] 85 (10/30 0639) Resp:  [10-22] 20 (10/30 0639) BP: (118-142)/(70-85) 142/79 (10/30 0639) SpO2:  [97 %-100 %] 98 % (10/30 0639) Weight:  [125.3 kg] 125.3 kg (10/29 1904) Last BM Date: 08/21/19  Intake/Output from previous day: 10/29 0701 - 10/30 0700 In: 2770 [P.O.:240; I.V.:2530] Out: 1450 [Urine:1150; Blood:300] Intake/Output this shift: No intake/output data recorded.  PE: Abd: soft, appropriately tender, hypoactive BS, obese, incisions c/d/i  Lab Results:  Recent Labs    08/19/19 1351  WBC 7.4  HGB 10.3*  HCT 35.4*  PLT 386   BMET Recent Labs    08/19/19 1351  NA 137  K 3.4*  CL 99  CO2 26  GLUCOSE 101*  BUN 9  CREATININE 1.19*  CALCIUM 9.1   PT/INR No results for input(s): LABPROT, INR in the last 72 hours. CMP     Component Value Date/Time   NA 137 08/19/2019 1351   K 3.4 (L) 08/19/2019 1351   CL 99 08/19/2019 1351   CO2 26 08/19/2019 1351   GLUCOSE 101 (H) 08/19/2019 1351   BUN 9 08/19/2019 1351   CREATININE 1.19 (H) 08/19/2019 1351   CALCIUM 9.1 08/19/2019 1351   PROT 7.2 05/03/2008 2000   ALBUMIN 3.6 05/03/2008 2000   AST 25 05/03/2008 2000   ALT 23 05/03/2008 2000   ALKPHOS 50 05/03/2008 2000   BILITOT 1.0 05/03/2008 2000   GFRNONAA 57 (L) 08/19/2019 1351   GFRAA >60 08/19/2019 1351   Lipase     Component Value Date/Time   LIPASE 16 05/03/2008 2000       Studies/Results: No results found.  Anti-infectives: Anti-infectives (From admission, onward)   Start     Dose/Rate Route Frequency Ordered Stop   08/21/19 0815  gentamicin (GARAMYCIN) 600 mg in  dextrose 5 % 100 mL IVPB     5 mg/kg  120 kg 115 mL/hr over 60 Minutes Intravenous To Short Stay 08/20/19 1528 08/21/19 1040   08/21/19 0815  clindamycin (CLEOCIN) IVPB 900 mg     900 mg 100 mL/hr over 30 Minutes Intravenous To Short Stay 08/20/19 1528 08/21/19 1604   08/20/19 1519  clindamycin (CLEOCIN) IVPB 900 mg  Status:  Discontinued     900 mg 100 mL/hr over 30 Minutes Intravenous 60 min pre-op 08/20/19 1519 08/20/19 1528   08/20/19 1519  gentamicin (GARAMYCIN) 600 mg in dextrose 5 % 100 mL IVPB  Status:  Discontinued     5 mg/kg  120 kg 115 mL/hr over 60 Minutes Intravenous 60 min pre-op 08/20/19 1519 08/20/19 1528       Assessment/Plan  POD 1, s/p rigid proctoscopy, Dr. Dema Severin in setting of hysterectomy  -no rectal injury noted at time of surgery -diet per GYN -further post surgical care per GYN   FEN - clear liquids, adv per primary  VTE - Lovenox ID - none, preop doses   LOS: 0 days    Henreitta Cea , [facility] Surgery 08/22/2019, 8:39 AM Please see Amion for pager number during day hours 7:00am-4:30pm

## 2019-08-25 LAB — SURGICAL PATHOLOGY

## 2019-08-25 NOTE — Anesthesia Postprocedure Evaluation (Signed)
Anesthesia Post Note  Patient: Elizabeth Guerra  Procedure(s) Performed: TOTAL LAPAROSCOPIC HYSTERECTOMY WITH BILATERAL SALPINGECTOMY (Bilateral Abdomen) LAPAROSCOPIC OVARIAN CYSTECTOMY (Left Abdomen) Laparoscopy Diagnostic (N/A Abdomen) Rigid Proctoscopy (N/A Rectum) Cystoscopy (Bladder)     Patient location during evaluation: PACU Anesthesia Type: General Level of consciousness: awake and alert Pain management: pain level controlled Vital Signs Assessment: post-procedure vital signs reviewed and stable Respiratory status: spontaneous breathing, nonlabored ventilation, respiratory function stable and patient connected to nasal cannula oxygen Cardiovascular status: blood pressure returned to baseline and stable Postop Assessment: no apparent nausea or vomiting Anesthetic complications: no    Last Vitals:  Vitals:   08/22/19 0853 08/22/19 1451  BP:  (!) 156/94  Pulse:  80  Resp: 12 18  Temp:  37.1 C  SpO2:  99%    Last Pain:  Vitals:   08/22/19 1451  TempSrc: Oral  PainSc:                  [name]

## 2021-01-24 ENCOUNTER — Encounter (INDEPENDENT_AMBULATORY_CARE_PROVIDER_SITE_OTHER): Payer: Self-pay

## 2021-01-24 ENCOUNTER — Telehealth (INDEPENDENT_AMBULATORY_CARE_PROVIDER_SITE_OTHER): Payer: Self-pay

## 2021-01-24 NOTE — Telephone Encounter (Signed)
This patient scheduled a New Patient appointment with Dr. Leafy Ro for 02/16/21.  She will be having blood work w/her specialist in a few weeks, prior to her appointment with [place].  She is wanting to know what all we draw for so she can make sure they request that when she gets these done so she'll have everything we need for her appointment.  She said you can message her in [place] or call her at [phone].

## 2021-01-24 NOTE — Telephone Encounter (Signed)
Pt notified that we cannot order labs for her until she is seen via mychart.

## 2021-02-16 ENCOUNTER — Encounter (INDEPENDENT_AMBULATORY_CARE_PROVIDER_SITE_OTHER): Payer: Self-pay | Admitting: Family Medicine

## 2021-02-16 ENCOUNTER — Other Ambulatory Visit: Payer: Self-pay

## 2021-02-16 ENCOUNTER — Ambulatory Visit (INDEPENDENT_AMBULATORY_CARE_PROVIDER_SITE_OTHER): Payer: 59 | Admitting: Family Medicine

## 2021-02-16 VITALS — BP 138/84 | HR 69 | Temp 97.5°F | Ht 64.0 in | Wt 271.0 lb

## 2021-02-16 DIAGNOSIS — Z6841 Body Mass Index (BMI) 40.0 and over, adult: Secondary | ICD-10-CM

## 2021-02-16 DIAGNOSIS — E559 Vitamin D deficiency, unspecified: Secondary | ICD-10-CM

## 2021-02-16 DIAGNOSIS — I10 Essential (primary) hypertension: Secondary | ICD-10-CM | POA: Diagnosis not present

## 2021-02-16 DIAGNOSIS — Z9189 Other specified personal risk factors, not elsewhere classified: Secondary | ICD-10-CM

## 2021-02-16 DIAGNOSIS — Z1331 Encounter for screening for depression: Secondary | ICD-10-CM | POA: Diagnosis not present

## 2021-02-16 DIAGNOSIS — R739 Hyperglycemia, unspecified: Secondary | ICD-10-CM | POA: Diagnosis not present

## 2021-02-16 DIAGNOSIS — Z0289 Encounter for other administrative examinations: Secondary | ICD-10-CM

## 2021-02-16 DIAGNOSIS — R5383 Other fatigue: Secondary | ICD-10-CM | POA: Diagnosis not present

## 2021-02-17 LAB — LIPID PANEL WITH LDL/HDL RATIO
Cholesterol, Total: 197 mg/dL (ref 100–199)
HDL: 46 mg/dL (ref >39)
LDL Chol Calc (NIH): 132 mg/dL — ABNORMAL HIGH (ref 0–99)
LDL/HDL Ratio: 2.9 ratio (ref 0.0–3.2)
Triglycerides: 103 mg/dL (ref 0–149)
VLDL Cholesterol Cal: 19 mg/dL (ref 5–40)

## 2021-02-17 LAB — CBC WITH DIFFERENTIAL/PLATELET
Basophils Absolute: 0 10*3/uL (ref 0.0–0.2)
Basos: 1 %
EOS (ABSOLUTE): 0.1 10*3/uL (ref 0.0–0.4)
Eos: 1 %
Hematocrit: 40 % (ref 34.0–46.6)
Hemoglobin: 12.7 g/dL (ref 11.1–15.9)
Immature Grans (Abs): 0 10*3/uL (ref 0.0–0.1)
Immature Granulocytes: 1 %
Lymphocytes Absolute: 2.2 10*3/uL (ref 0.7–3.1)
Lymphs: 34 %
MCH: 26.6 pg (ref 26.6–33.0)
MCHC: 31.8 g/dL (ref 31.5–35.7)
MCV: 84 fL (ref 79–97)
Monocytes Absolute: 0.4 10*3/uL (ref 0.1–0.9)
Monocytes: 6 %
Neutrophils Absolute: 3.6 10*3/uL (ref 1.4–7.0)
Neutrophils: 57 %
Platelets: 359 10*3/uL (ref 150–450)
RBC: 4.77 x10E6/uL (ref 3.77–5.28)
RDW: 13.3 % (ref 11.7–15.4)
WBC: 6.3 10*3/uL (ref 3.4–10.8)

## 2021-02-17 LAB — COMPREHENSIVE METABOLIC PANEL WITH GFR
ALT: 14 IU/L (ref 0–32)
AST: 17 IU/L (ref 0–40)
Albumin/Globulin Ratio: 1.3 (ref 1.2–2.2)
Albumin: 4.2 g/dL (ref 3.8–4.8)
Alkaline Phosphatase: 67 IU/L (ref 44–121)
BUN/Creatinine Ratio: 8 — ABNORMAL LOW (ref 9–23)
BUN: 9 mg/dL (ref 6–24)
Bilirubin Total: 0.5 mg/dL (ref 0.0–1.2)
CO2: 27 mmol/L (ref 20–29)
Calcium: 9.3 mg/dL (ref 8.7–10.2)
Chloride: 96 mmol/L (ref 96–106)
Creatinine, Ser: 1.18 mg/dL — ABNORMAL HIGH (ref 0.57–1.00)
Globulin, Total: 3.2 g/dL (ref 1.5–4.5)
Glucose: 85 mg/dL (ref 65–99)
Potassium: 3.7 mmol/L (ref 3.5–5.2)
Sodium: 137 mmol/L (ref 134–144)
Total Protein: 7.4 g/dL (ref 6.0–8.5)
eGFR: 59 mL/min/1.73 — ABNORMAL LOW

## 2021-02-17 LAB — VITAMIN D 25 HYDROXY (VIT D DEFICIENCY, FRACTURES): Vit D, 25-Hydroxy: 28.3 ng/mL — ABNORMAL LOW (ref 30.0–100.0)

## 2021-02-17 LAB — VITAMIN B12: Vitamin B-12: 794 pg/mL (ref 232–1245)

## 2021-02-17 LAB — T4: T4, Total: 10.9 ug/dL (ref 4.5–12.0)

## 2021-02-17 LAB — HEMOGLOBIN A1C
Est. average glucose Bld gHb Est-mCnc: 105 mg/dL
Hgb A1c MFr Bld: 5.3 % (ref 4.8–5.6)

## 2021-02-17 LAB — FOLATE: Folate: 7.6 ng/mL

## 2021-02-17 LAB — T3: T3, Total: 146 ng/dL (ref 71–180)

## 2021-02-17 LAB — TSH: TSH: 2.32 u[IU]/mL (ref 0.450–4.500)

## 2021-02-17 LAB — INSULIN, RANDOM: INSULIN: 26.4 u[IU]/mL — ABNORMAL HIGH (ref 2.6–24.9)

## 2021-02-17 NOTE — Progress Notes (Signed)
Chief Complaint:   OBESITY Elizabeth Guerra (MR# [id number]) is a 45 y.o. female who presents for evaluation and treatment of obesity and related comorbidities. Current BMI is Body mass index is 46.52 kg/m. Elizabeth Guerra has been struggling with her weight for many years and has been unsuccessful in either losing weight, maintaining weight loss, or reaching her healthy weight goal.  Elizabeth Guerra is currently in the action stage of change and ready to dedicate time achieving and maintaining a healthier weight. Elizabeth Guerra is interested in becoming our patient and working on intensive lifestyle modifications including (but not limited to) diet and exercise for weight loss.  Elizabeth Guerra's habits were reviewed today and are as follows: Her family eats meals together, her desired weight loss is 111 lbs, she has been heavy most of her life, she started gaining weight at 45 years old, her heaviest weight ever was 283 pounds, she has significant food cravings issues, she snacks frequently in the evenings, she skips meals frequently, she is frequently drinking liquids with calories, she frequently makes poor food choices, she has problems with excessive hunger, she frequently eats larger portions than normal, she has binge eating behaviors and she struggles with emotional eating.  Depression Screen Elizabeth Guerra's Food and Mood (modified PHQ-9) score was 21.  Depression screen Elizabeth Guerra 2/9 02/16/2021  Decreased Interest 3  Down, Depressed, Hopeless 3  PHQ - 2 Score 6  Altered sleeping 3  Tired, decreased energy 3  Change in appetite 3  Feeling bad or failure about yourself  2  Trouble concentrating 3  Moving slowly or fidgety/restless 0  Suicidal thoughts 1  PHQ-9 Score 21  Difficult doing work/chores Very difficult   Subjective:   1. Other fatigue Cyan admits to daytime somnolence and admits to waking up still tired. Patent has a history of symptoms of daytime fatigue and morning headache. Elizabeth Guerra generally gets 4  or 6 hours of sleep per night, and states that she has nightime awakenings. Snoring is present. Apneic episodes are not present. Epworth Sleepiness Score is 11.  2. Vitamin D deficiency Elizabeth Guerra is on OTC Vit D 1,000 IU daily. She has no recent labs and she notes fatigue.  3. Primary hypertension Elizabeth Guerra blood pressure is borderline elevated today. She is on medication and she is working on weight loss.  4. Hyperglycemia Elizabeth Guerra has a history of some elevated glucose readings. She denies a history of diabetes mellitus.  5. At risk for heart disease Elizabeth Guerra is at a higher than average risk for cardiovascular disease due to obesity.   Assessment/Plan:   1. Other fatigue Elizabeth Guerra does feel that her weight is causing her energy to be lower than it should be. Fatigue may be related to obesity, depression or many other causes. Labs will be ordered, and in the meanwhile, Elizabeth Guerra will focus on self care including making healthy food choices, increasing physical activity and focusing on stress reduction.  - Vitamin B12 - CBC with Differential/Platelet - EKG 12-Lead - Folate - Lipid Panel With LDL/HDL Ratio - T3 - T4 - TSH  2. Vitamin D deficiency Low Vitamin D level contributes to fatigue and are associated with obesity, breast, and colon cancer. We will check labs today. Elizabeth Guerra will follow-up for routine testing of Vitamin D, at least 2-3 times per year to avoid over-replacement.  - VITAMIN D 25 Hydroxy (Vit-D Deficiency, Fractures)  3. Primary hypertension We will check labs today. Elizabeth Guerra will start her Category 3 plan, and will continue working on  healthy weight loss and exercise to improve blood pressure control. We will watch for signs of hypotension as she continues her lifestyle modifications.  - Comprehensive metabolic panel  4. Hyperglycemia Fasting labs will be obtained today, and results with be discussed with Elizabeth Guerra in 2 weeks at her follow up visit. In the meanwhile  Elizabeth Guerra will start her Category 3 plan and will work on weight loss efforts.  - Hemoglobin A1c - Insulin, random  5. Screening for depression Elizabeth Guerra had a positive depression screening. Depression is commonly associated with obesity and often results in emotional eating behaviors. We will monitor this closely and work on CBT to help improve the non-hunger eating patterns. Referral to Psychology may be required if no improvement is seen as she continues in our clinic.  6. At risk for heart disease Elizabeth Guerra was given approximately 30 minutes of coronary artery disease prevention counseling today. She is 45 y.o. female and has risk factors for heart disease including obesity. We discussed intensive lifestyle modifications today with an emphasis on specific weight loss instructions and strategies.   Repetitive spaced learning was employed today to elicit superior memory formation and behavioral change.  7. Class 3 severe obesity with serious comorbidity and body mass index (BMI) of 45.0 to 49.9 in adult, unspecified obesity type (HCC) Elizabeth Guerra is currently in the action stage of change and her goal is to continue with weight loss efforts. I recommend Elizabeth Guerra begin the structured treatment plan as follows:  She has agreed to the Category 3 Plan.  Exercise goals: No exercise has been prescribed for now, while we concentrate on nutritional changes.   Behavioral modification strategies: increasing lean protein intake and meal planning and cooking strategies.  She was informed of the importance of frequent follow-up visits to maximize her success with intensive lifestyle modifications for her multiple health conditions. She was informed we would discuss her lab results at her next visit unless there is a critical issue that needs to be addressed sooner. Elizabeth Guerra agreed to keep her next visit at the agreed upon time to discuss these results.  Objective:   Blood pressure 138/84, pulse 69, temperature  (!) 97.5 F (36.4 C), height 5\' 4"  (1.626 m), weight 271 lb (122.9 kg), SpO2 98 %. Body mass index is 46.52 kg/m.  EKG: Normal sinus rhythm, rate 65 BPM.  Indirect Calorimeter completed today shows a VO2 of 307 and a REE of 2134.  Her calculated basal metabolic rate is 8588 thus her basal metabolic rate is better than expected.  General: Cooperative, alert, well developed, in no acute distress. HEENT: Conjunctivae and lids unremarkable. Cardiovascular: Regular rhythm.  Lungs: Normal work of breathing. Neurologic: No focal deficits.   Lab Results  Component Value Date   CREATININE 1.18 (H) 02/16/2021   BUN 9 02/16/2021   NA 137 02/16/2021   K 3.7 02/16/2021   CL 96 02/16/2021   CO2 27 02/16/2021   Lab Results  Component Value Date   ALT 14 02/16/2021   AST 17 02/16/2021   ALKPHOS 67 02/16/2021   BILITOT 0.5 02/16/2021   Lab Results  Component Value Date   HGBA1C 5.3 02/16/2021   Lab Results  Component Value Date   INSULIN 26.4 (H) 02/16/2021   Lab Results  Component Value Date   TSH 2.320 02/16/2021   Lab Results  Component Value Date   CHOL 197 02/16/2021   HDL 46 02/16/2021   LDLCALC 132 (H) 02/16/2021   TRIG 103 02/16/2021   Lab  Results  Component Value Date   WBC 6.3 02/16/2021   HGB 12.7 02/16/2021   HCT 40.0 02/16/2021   MCV 84 02/16/2021   PLT 359 02/16/2021   No results found for: IRON, TIBC, FERRITIN  Attestation Statements:   Reviewed by clinician on day of visit: allergies, medications, problem list, medical history, surgical history, family history, social history, and previous encounter notes.   I, Trixie Dredge, am acting as transcriptionist for Dennard Nip, MD.  I have reviewed the above documentation for accuracy and completeness, and I agree with the above. - Dennard Nip, MD

## 2021-03-02 ENCOUNTER — Ambulatory Visit (INDEPENDENT_AMBULATORY_CARE_PROVIDER_SITE_OTHER): Payer: 59 | Admitting: Family Medicine

## 2021-03-30 ENCOUNTER — Encounter (INDEPENDENT_AMBULATORY_CARE_PROVIDER_SITE_OTHER): Payer: Self-pay | Admitting: Family Medicine

## 2021-03-30 ENCOUNTER — Ambulatory Visit (INDEPENDENT_AMBULATORY_CARE_PROVIDER_SITE_OTHER): Payer: 59 | Admitting: Family Medicine

## 2021-03-30 ENCOUNTER — Other Ambulatory Visit: Payer: Self-pay

## 2021-03-30 VITALS — HR 73 | Temp 98.4°F | Ht 64.0 in | Wt 269.0 lb

## 2021-03-30 DIAGNOSIS — Z9189 Other specified personal risk factors, not elsewhere classified: Secondary | ICD-10-CM

## 2021-03-30 DIAGNOSIS — E559 Vitamin D deficiency, unspecified: Secondary | ICD-10-CM

## 2021-03-30 DIAGNOSIS — I10 Essential (primary) hypertension: Secondary | ICD-10-CM | POA: Diagnosis not present

## 2021-03-30 DIAGNOSIS — N1831 Chronic kidney disease, stage 3a: Secondary | ICD-10-CM | POA: Diagnosis not present

## 2021-03-30 DIAGNOSIS — E8881 Metabolic syndrome: Secondary | ICD-10-CM | POA: Diagnosis not present

## 2021-03-30 DIAGNOSIS — Z6841 Body Mass Index (BMI) 40.0 and over, adult: Secondary | ICD-10-CM

## 2021-03-30 DIAGNOSIS — F3289 Other specified depressive episodes: Secondary | ICD-10-CM

## 2021-03-30 MED ORDER — BUPROPION HCL ER (SR) 150 MG PO TB12: 150.0000 mg | ORAL_TABLET | Freq: Every morning | ORAL | 0 refills | Status: DC

## 2021-03-30 MED ORDER — VITAMIN D (ERGOCALCIFEROL) 1.25 MG (50000 UNIT) PO CAPS: 50000.0000 [IU] | ORAL_CAPSULE | ORAL | 0 refills | Status: DC

## 2021-04-07 NOTE — Progress Notes (Signed)
Chief Complaint:   OBESITY Elizabeth Guerra is here to discuss her progress with her obesity treatment plan along with follow-up of her obesity related diagnoses. Elizabeth Guerra is on the Category 3 Plan and states she is following her eating plan approximately 70% of the time. Elizabeth Guerra states she is doing 0 minutes 0 times per week.  Today's visit was #: 2 Starting weight: 271 lbs Starting date: 02/16/2021 Today's weight: 269 lbs Today's date: 03/30/2021 Total lbs lost to date: 2 Total lbs lost since last in-office visit: 2  Interim History: Elizabeth Guerra did well trying to follow her Category 3 plan. She found dinner difficult to follow due to being very tired at the end of the day.  Subjective:   1. Essential hypertension Elizabeth Guerra's blood pressure is elevated today. She notes increased stress today and traffic, and anticipated that her blood pressure would be elevated. She denies chest pain or headache. Her last blood pressure in the office was within normal limits. I discussed labs with the patient today.  2. Chronic renal impairment, stage 3a (Elizabeth Guerra) Elizabeth Guerra has a history of hypertension, and she is on Lasix and is followed by her Nephrologist. I discussed labs with the patient today.  3. Vitamin D deficiency Elizabeth Guerra is not on Vit D currently, but she has been in the past. She notes fatigue. I discussed labs with the patient today.  4. Insulin resistance Elizabeth Guerra has a new diagnosis of insulin resistance. She has a normal glucose and A1c, but her fasting insulin is elevated. She notes polyphagia, but this has improved on her eating plan. I discussed labs with the patient today.  5. Other depression with emotional eating Elizabeth Guerra notes significant emotional eating behaviors. She has been on Zoloft in the past and she is open to starting Wellbutrin.  6. At risk for diabetes mellitus Elizabeth Guerra is at higher than average risk for developing diabetes due to obesity.   Assessment/Plan:   1. Essential  hypertension Elizabeth Guerra will continue her medications and healthy weight loss to improve blood pressure control. We will recheck her blood pressure in 2 weeks, and will watch for signs of hypotension as she continues her lifestyle modifications.  2. Chronic renal impairment, stage 3a (Elizabeth Guerra) Elizabeth Guerra was encouraged to continue weight loss and tight blood pressure control.  3. Vitamin D deficiency Low Vitamin D level contributes to fatigue and are associated with obesity, breast, and colon cancer. Elizabeth Guerra agreed to start prescription Vitamin D 50,000 IU every week with no refills. We will recheck labs in 3 month, and she will follow-up for routine testing of Vitamin D, at least 2-3 times per year to avoid over-replacement.  - Vitamin D, Ergocalciferol, (DRISDOL) 1.25 MG (50000 UNIT) CAPS capsule; Take 1 capsule (50,000 Units total) by mouth every 7 (seven) days.  Dispense: 4 capsule; Refill: 0  4. Insulin resistance Elizabeth Guerra will continue her Category 3 plan, and will continue to work on weight loss, exercise, and decreasing simple carbohydrates to help decrease the risk of diabetes. Elizabeth Guerra agreed to follow-up with [place] as directed to closely monitor her progress.  5. Other depression with emotional eating Behavior modification techniques were discussed today to help Elizabeth Guerra deal with her emotional/non-hunger eating behaviors. Elizabeth Guerra agreed to start Wellbutrin SR 150 mg q AM with no refills. Orders and follow up as documented in patient record.   - buPROPion (WELLBUTRIN SR) 150 MG 12 hr tablet; Take 1 tablet (150 mg total) by mouth every morning.  Dispense: 30 tablet; Refill: 0  6. At risk for diabetes mellitus Elizabeth Guerra was given approximately 30 minutes of diabetes education and counseling today. We discussed intensive lifestyle modifications today with an emphasis on weight loss as well as increasing exercise and decreasing simple carbohydrates in her diet. We also reviewed medication options with an  emphasis on risk versus benefit of those discussed.   Repetitive spaced learning was employed today to elicit superior memory formation and behavioral change.  7. Obesity witrh current BMI 46.3 Elizabeth Guerra is currently in the action stage of change. As such, her goal is to continue with weight loss efforts. She has agreed to the Category 3 Plan and keeping a food journal and adhering to recommended goals of 400-550 calories and 40+ grams of protein at supper daily.   Behavioral modification strategies: increasing lean protein intake and meal planning and cooking strategies.  Elizabeth Guerra has agreed to follow-up with our clinic in 2 weeks. She was informed of the importance of frequent follow-up visits to maximize her success with intensive lifestyle modifications for her multiple health conditions.   Objective:   Pulse 73, temperature 98.4 F (36.9 C), height 5\' 4"  (1.626 m), weight 269 lb (122 kg), SpO2 98 %. Body mass index is 46.17 kg/m.  General: Cooperative, alert, well developed, in no acute distress. HEENT: Conjunctivae and lids unremarkable. Cardiovascular: Regular rhythm.  Lungs: Normal work of breathing. Neurologic: No focal deficits.   Lab Results  Component Value Date   CREATININE 1.18 (H) 02/16/2021   BUN 9 02/16/2021   NA 137 02/16/2021   K 3.7 02/16/2021   CL 96 02/16/2021   CO2 27 02/16/2021   Lab Results  Component Value Date   ALT 14 02/16/2021   AST 17 02/16/2021   ALKPHOS 67 02/16/2021   BILITOT 0.5 02/16/2021   Lab Results  Component Value Date   HGBA1C 5.3 02/16/2021   Lab Results  Component Value Date   INSULIN 26.4 (H) 02/16/2021   Lab Results  Component Value Date   TSH 2.320 02/16/2021   Lab Results  Component Value Date   CHOL 197 02/16/2021   HDL 46 02/16/2021   LDLCALC 132 (H) 02/16/2021   TRIG 103 02/16/2021   Lab Results  Component Value Date   WBC 6.3 02/16/2021   HGB 12.7 02/16/2021   HCT 40.0 02/16/2021   MCV 84 02/16/2021    PLT 359 02/16/2021   No results found for: IRON, TIBC, FERRITIN  Attestation Statements:   Reviewed by clinician on day of visit: allergies, medications, problem list, medical history, surgical history, family history, social history, and previous encounter notes.   I, Trixie Dredge, am acting as transcriptionist for Dennard Nip, MD.  I have reviewed the above documentation for accuracy and completeness, and I agree with the above. -  Dennard Nip, MD

## 2021-04-18 ENCOUNTER — Ambulatory Visit (INDEPENDENT_AMBULATORY_CARE_PROVIDER_SITE_OTHER): Payer: 59 | Admitting: Family Medicine

## 2021-05-04 ENCOUNTER — Other Ambulatory Visit (INDEPENDENT_AMBULATORY_CARE_PROVIDER_SITE_OTHER): Payer: Self-pay | Admitting: Family Medicine

## 2021-05-04 DIAGNOSIS — F3289 Other specified depressive episodes: Secondary | ICD-10-CM

## 2021-05-04 NOTE — Telephone Encounter (Signed)
Pt last seen by Dr. Beasley.  

## 2021-05-05 NOTE — Telephone Encounter (Signed)
LAST APPOINTMENT DATE: 04/18/2021  NEXT APPOINTMENT DATE: 06/08/2021   [id number] [place], [place] - [address] AT [facility] [address] [place] [address] Phone: [phone] Fax: [phone]  [facility] [address] [place] - [address] [place] [place] [address] Phone: [phone] Fax: [phone]  Patient is requesting a refill of the following medications: Requested Prescriptions   Pending Prescriptions Disp Refills   buPROPion (WELLBUTRIN SR) 150 MG 12 hr tablet 30 tablet 0    Sig: Take 1 tablet (150 mg total) by mouth every morning.    Date last filled: 03/30/21 Previously prescribed by [facility]  Lab Results  Component Value Date   HGBA1C 5.3 02/16/2021   Lab Results  Component Value Date   LDLCALC 132 (H) 02/16/2021   CREATININE 1.18 (H) 02/16/2021   Lab Results  Component Value Date   VD25OH 28.3 (L) 02/16/2021    BP Readings from Last 3 Encounters:  02/16/21 138/84  08/22/19 (!) 156/94  08/19/19 (!) 152/93

## 2021-05-10 ENCOUNTER — Other Ambulatory Visit (INDEPENDENT_AMBULATORY_CARE_PROVIDER_SITE_OTHER): Payer: Self-pay | Admitting: Family Medicine

## 2021-05-10 DIAGNOSIS — F3289 Other specified depressive episodes: Secondary | ICD-10-CM

## 2021-05-10 NOTE — Telephone Encounter (Signed)
Pt last seen by Dr. Beasley.  

## 2021-05-11 ENCOUNTER — Other Ambulatory Visit (INDEPENDENT_AMBULATORY_CARE_PROVIDER_SITE_OTHER): Payer: Self-pay | Admitting: Family Medicine

## 2021-05-11 ENCOUNTER — Encounter (INDEPENDENT_AMBULATORY_CARE_PROVIDER_SITE_OTHER): Payer: Self-pay

## 2021-05-11 DIAGNOSIS — F3289 Other specified depressive episodes: Secondary | ICD-10-CM

## 2021-05-11 MED ORDER — BUPROPION HCL ER (SR) 150 MG PO TB12: 150.0000 mg | ORAL_TABLET | Freq: Every morning | ORAL | 0 refills | Status: DC

## 2021-05-11 NOTE — Telephone Encounter (Signed)
Message sent to pt-CAS 

## 2021-06-08 ENCOUNTER — Encounter (INDEPENDENT_AMBULATORY_CARE_PROVIDER_SITE_OTHER): Payer: Self-pay | Admitting: Family Medicine

## 2021-06-08 ENCOUNTER — Other Ambulatory Visit: Payer: Self-pay

## 2021-06-08 ENCOUNTER — Ambulatory Visit (INDEPENDENT_AMBULATORY_CARE_PROVIDER_SITE_OTHER): Payer: 59 | Admitting: Family Medicine

## 2021-06-08 VITALS — BP 156/93 | HR 69 | Temp 98.1°F | Ht 64.0 in | Wt 264.0 lb

## 2021-06-08 DIAGNOSIS — E66813 Obesity, class 3: Secondary | ICD-10-CM

## 2021-06-08 DIAGNOSIS — F3289 Other specified depressive episodes: Secondary | ICD-10-CM | POA: Diagnosis not present

## 2021-06-08 DIAGNOSIS — R059 Cough, unspecified: Secondary | ICD-10-CM | POA: Diagnosis not present

## 2021-06-08 DIAGNOSIS — E559 Vitamin D deficiency, unspecified: Secondary | ICD-10-CM

## 2021-06-08 DIAGNOSIS — Z9189 Other specified personal risk factors, not elsewhere classified: Secondary | ICD-10-CM

## 2021-06-08 DIAGNOSIS — Z6841 Body Mass Index (BMI) 40.0 and over, adult: Secondary | ICD-10-CM | POA: Diagnosis not present

## 2021-06-08 MED ORDER — BUPROPION HCL ER (SR) 150 MG PO TB12: 150.0000 mg | ORAL_TABLET | Freq: Every morning | ORAL | 0 refills | Status: DC

## 2021-06-08 MED ORDER — VITAMIN D (ERGOCALCIFEROL) 1.25 MG (50000 UNIT) PO CAPS: 50000.0000 [IU] | ORAL_CAPSULE | ORAL | 0 refills | Status: DC

## 2021-06-09 NOTE — Progress Notes (Signed)
Chief Complaint:   OBESITY Elizabeth Guerra is here to discuss her progress with her obesity treatment plan along with follow-up of her obesity related diagnoses. Elizabeth Guerra is on the Category 3 Plan and keeping a food journal and adhering to recommended goals of 400-550 calories and 40+ grams of protein at supper daily and states she is following her eating plan approximately 0% of the time. Elizabeth Guerra states she is walking for 30 minutes 3 times per week.  Today's visit was #: 3 Starting weight: 271 lbs Starting date: 02/16/2021 Today's weight: 264 lbs Today's date: 06/08/2021 Total lbs lost to date: 7 Total lbs lost since last in-office visit: 5  Interim History: Elizabeth Guerra is recovering from a COVID infection. She lost her taste and smell. She has still worked on eating healthier and was able to lose weight. She is starting to recover her taste.  Subjective:   1. Cough Elizabeth Guerra is recovering from COVID, and she hasn't slept well due to consistent cough, worse in the PM.  2. Vitamin D deficiency Elizabeth Guerra is stable on Vit D, and she denies nausea, vomiting, or muscle weakness.  3. Other depression with emotional eating Elizabeth Guerra started Wellbutrin and she felt it has helped. She still notes some PM cravings.  4. At risk for impaired metabolic function Elizabeth Guerra is at increased risk for impaired metabolic function due to protein decreasing.  Assessment/Plan:   1. Cough Lowell is ok to take OTC Delsym as needed, and she will continue to follow up as directed.  2. Vitamin D deficiency Low Vitamin D level contributes to fatigue and are associated with obesity, breast, and colon cancer. We will refill prescription Vitamin D for 1 month. Kytzia will follow-up for routine testing of Vitamin D, at least 2-3 times per year to avoid over-replacement.  - Vitamin D, Ergocalciferol, (DRISDOL) 1.25 MG (50000 UNIT) CAPS capsule; Take 1 capsule (50,000 Units total) by mouth every 7 (seven) days.  Dispense: 4  capsule; Refill: 0  3. Other depression with emotional eating Behavior modification techniques were discussed today to help Elizabeth Guerra deal with her emotional/non-hunger eating behaviors. We will refill Wellbutrin SR for 1 month. Orders and follow up as documented in patient record.   - buPROPion (WELLBUTRIN SR) 150 MG 12 hr tablet; Take 1 tablet (150 mg total) by mouth every morning.  Dispense: 30 tablet; Refill: 0  4. At risk for impaired metabolic function Elizabeth Guerra was given approximately 30 minutes of impaired  metabolic function prevention counseling today. We discussed intensive lifestyle modifications today with an emphasis on specific nutrition and exercise instructions and strategies.   Repetitive spaced learning was employed today to elicit superior memory formation and behavioral change.  5. Obesity with current BMI 45.4 Roger is currently in the action stage of change. As such, her goal is to continue with weight loss efforts. She has agreed to the Category 3 Plan.   Exercise goals: As is.  Behavioral modification strategies: increasing lean protein intake.  Elizabeth Guerra has agreed to follow-up with our clinic in 2 to 3 weeks. She was informed of the importance of frequent follow-up visits to maximize her success with intensive lifestyle modifications for her multiple health conditions.   Objective:   Blood pressure (!) 156/93, pulse 69, temperature 98.1 F (36.7 C), height 5\' 4"  (1.626 m), weight 264 lb (119.7 kg), SpO2 98 %. Body mass index is 45.32 kg/m.  General: Cooperative, alert, well developed, in no acute distress. HEENT: Conjunctivae and lids unremarkable. Cardiovascular: Regular rhythm.  Lungs: Normal work of breathing. Neurologic: No focal deficits.   Lab Results  Component Value Date   CREATININE 1.18 (H) 02/16/2021   BUN 9 02/16/2021   NA 137 02/16/2021   K 3.7 02/16/2021   CL 96 02/16/2021   CO2 27 02/16/2021   Lab Results  Component Value Date   ALT  14 02/16/2021   AST 17 02/16/2021   ALKPHOS 67 02/16/2021   BILITOT 0.5 02/16/2021   Lab Results  Component Value Date   HGBA1C 5.3 02/16/2021   Lab Results  Component Value Date   INSULIN 26.4 (H) 02/16/2021   Lab Results  Component Value Date   TSH 2.320 02/16/2021   Lab Results  Component Value Date   CHOL 197 02/16/2021   HDL 46 02/16/2021   LDLCALC 132 (H) 02/16/2021   TRIG 103 02/16/2021   Lab Results  Component Value Date   VD25OH 28.3 (L) 02/16/2021   Lab Results  Component Value Date   WBC 6.3 02/16/2021   HGB 12.7 02/16/2021   HCT 40.0 02/16/2021   MCV 84 02/16/2021   PLT 359 02/16/2021   No results found for: IRON, TIBC, FERRITIN  Attestation Statements:   Reviewed by clinician on day of visit: allergies, medications, problem list, medical history, surgical history, family history, social history, and previous encounter notes.   I, Trixie Dredge, am acting as transcriptionist for Dennard Nip, MD.  I have reviewed the above documentation for accuracy and completeness, and I agree with the above. -  Dennard Nip, MD

## 2021-06-30 ENCOUNTER — Ambulatory Visit (INDEPENDENT_AMBULATORY_CARE_PROVIDER_SITE_OTHER): Payer: 59 | Admitting: Family Medicine

## 2021-07-18 ENCOUNTER — Other Ambulatory Visit: Payer: Self-pay

## 2021-07-18 ENCOUNTER — Ambulatory Visit (INDEPENDENT_AMBULATORY_CARE_PROVIDER_SITE_OTHER): Payer: 59 | Admitting: Family Medicine

## 2021-07-18 ENCOUNTER — Encounter (INDEPENDENT_AMBULATORY_CARE_PROVIDER_SITE_OTHER): Payer: Self-pay | Admitting: Family Medicine

## 2021-07-18 VITALS — BP 167/109 | HR 91 | Temp 98.1°F | Ht 64.0 in | Wt 266.0 lb

## 2021-07-18 DIAGNOSIS — I10 Essential (primary) hypertension: Secondary | ICD-10-CM | POA: Diagnosis not present

## 2021-07-18 DIAGNOSIS — F418 Other specified anxiety disorders: Secondary | ICD-10-CM | POA: Diagnosis not present

## 2021-07-18 DIAGNOSIS — Z9189 Other specified personal risk factors, not elsewhere classified: Secondary | ICD-10-CM

## 2021-07-18 DIAGNOSIS — Z6841 Body Mass Index (BMI) 40.0 and over, adult: Secondary | ICD-10-CM

## 2021-07-18 MED ORDER — ESCITALOPRAM OXALATE 10 MG PO TABS: 10.0000 mg | ORAL_TABLET | Freq: Every day | ORAL | 0 refills | Status: DC

## 2021-07-18 NOTE — Progress Notes (Signed)
Chief Complaint:   OBESITY Elizabeth Guerra is here to discuss her progress with her obesity treatment plan along with follow-up of her obesity related diagnoses. Elizabeth Guerra is on the Category 3 Plan and states she is following her eating plan approximately 30% of the time. Elizabeth Guerra states she is doing 0 minutes 0 times per week.  Today's visit was #: 4 Starting weight: 271 lbs Starting date: 02/16/2021 Today's weight: 266 lbs Today's date: 07/18/2021 Total lbs lost to date: 5 Total lbs lost since last in-office visit: 0  Interim History: Elizabeth Guerra hasn't been able to concentrate on weight loss. Her aunt died unexpectedly, and she has multiple other severe stressors which has made meal planning very difficult. She is trying to be mindful however.  Subjective:   1. Essential hypertension Elizabeth Guerra's blood pressure is elevated. She is on multiple medications and she is taking them regularly but her stress level is much elevated.  2. Depression with anxiety Elizabeth Guerra is on Wellbutrin, but her stress and anxiety has significantly worsened lately. She is open to looking at other medication options to help.  3. At risk for heart disease Elizabeth Guerra is at a higher than average risk for cardiovascular disease due to obesity.   Assessment/Plan:   1. Essential hypertension Elizabeth Guerra was advised to discuss this with her primary care provider as soon as possible, and if it is stress induced hopefully the Lexapro will be able to help her. She will continue working on healthy weight loss and exercise to improve blood pressure control.   2. Depression with anxiety Behavior modification techniques were discussed today to help Elizabeth Guerra deal with her emotional/non-hunger eating behaviors. Sugar agreed to start Lexapro 10 mg q daily #30 with no refills. Orders and follow up as documented in patient record.   3. At risk for heart disease Elizabeth Guerra was given approximately 15 minutes of coronary artery disease prevention  counseling today. She is 45 y.o. female and has risk factors for heart disease including obesity. We discussed intensive lifestyle modifications today with an emphasis on specific weight loss instructions and strategies.   Repetitive spaced learning was employed today to elicit superior memory formation and behavioral change.  4. Obesity with current BMI 45.7 Elizabeth Guerra is currently in the action stage of change. As such, her goal is to continue with weight loss efforts. She has agreed to the Category 3 Plan and keeping a food journal and adhering to recommended goals of 400-500 calories and 40 grams of protein daily.   Behavioral modification strategies: increasing lean protein intake and emotional eating strategies.  Elizabeth Guerra has agreed to follow-up with our clinic in 2 weeks. She was informed of the importance of frequent follow-up visits to maximize her success with intensive lifestyle modifications for her multiple health conditions.   Objective:   Blood pressure (!) 167/109, pulse 91, temperature 98.1 F (36.7 C), height 5\' 4"  (1.626 m), weight 266 lb (120.7 kg), SpO2 98 %. Body mass index is 45.66 kg/m.  General: Cooperative, alert, well developed, in no acute distress. HEENT: Conjunctivae and lids unremarkable. Cardiovascular: Regular rhythm.  Lungs: Normal work of breathing. Neurologic: No focal deficits.   Lab Results  Component Value Date   CREATININE 1.18 (H) 02/16/2021   BUN 9 02/16/2021   NA 137 02/16/2021   K 3.7 02/16/2021   CL 96 02/16/2021   CO2 27 02/16/2021   Lab Results  Component Value Date   ALT 14 02/16/2021   AST 17 02/16/2021   ALKPHOS 67  02/16/2021   BILITOT 0.5 02/16/2021   Lab Results  Component Value Date   HGBA1C 5.3 02/16/2021   Lab Results  Component Value Date   INSULIN 26.4 (H) 02/16/2021   Lab Results  Component Value Date   TSH 2.320 02/16/2021   Lab Results  Component Value Date   CHOL 197 02/16/2021   HDL 46 02/16/2021    LDLCALC 132 (H) 02/16/2021   TRIG 103 02/16/2021   Lab Results  Component Value Date   VD25OH 28.3 (L) 02/16/2021   Lab Results  Component Value Date   WBC 6.3 02/16/2021   HGB 12.7 02/16/2021   HCT 40.0 02/16/2021   MCV 84 02/16/2021   PLT 359 02/16/2021   No results found for: IRON, TIBC, FERRITIN  Attestation Statements:   Reviewed by clinician on day of visit: allergies, medications, problem list, medical history, surgical history, family history, social history, and previous encounter notes.   I, Trixie Dredge, am acting as transcriptionist for Dennard Nip, MD.  I have reviewed the above documentation for accuracy and completeness, and I agree with the above. -  Dennard Nip, MD

## 2021-07-19 ENCOUNTER — Other Ambulatory Visit (INDEPENDENT_AMBULATORY_CARE_PROVIDER_SITE_OTHER): Payer: Self-pay | Admitting: Family Medicine

## 2021-07-19 DIAGNOSIS — F3289 Other specified depressive episodes: Secondary | ICD-10-CM

## 2021-07-19 DIAGNOSIS — E559 Vitamin D deficiency, unspecified: Secondary | ICD-10-CM

## 2021-07-20 NOTE — Telephone Encounter (Signed)
LAST APPOINTMENT DATE: 07/18/21 NEXT APPOINTMENT DATE: 08/02/21   [id number] [place], [place] - [address] AT [facility] OF [address] [place] [address] Phone: [phone] Fax: [phone]  [facility] [address] [place] - [address] [place] [address] Phone: [phone] Fax: [phone]  Patient is requesting a refill of the following medications: Requested Prescriptions   Pending Prescriptions Disp Refills   buPROPion (WELLBUTRIN SR) 150 MG 12 hr tablet [Pharmacy Med Name: BUPROPION SR 150MG  TABLETS (12 H)] 30 tablet 0    Sig: TAKE 1 TABLET(150 MG) BY MOUTH EVERY MORNING   Vitamin D, Ergocalciferol, (DRISDOL) 1.25 MG (50000 UNIT) CAPS capsule [Pharmacy Med Name: VITAMIN D2 50,000IU (ERGO) CAP RX] 4 capsule 0    Sig: TAKE 1 CAPSULE BY MOUTH EVERY 7 DAYS    Date last filled: 06/08/21 Previously prescribed by [facility]  Lab Results  Component Value Date   HGBA1C 5.3 02/16/2021   Lab Results  Component Value Date   LDLCALC 132 (H) 02/16/2021   CREATININE 1.18 (H) 02/16/2021   Lab Results  Component Value Date   VD25OH 28.3 (L) 02/16/2021    BP Readings from Last 3 Encounters:  07/18/21 (!) 167/109  06/08/21 (!) 156/93  02/16/21 138/84

## 2021-08-02 ENCOUNTER — Ambulatory Visit (INDEPENDENT_AMBULATORY_CARE_PROVIDER_SITE_OTHER): Payer: 59 | Admitting: Family Medicine

## 2021-08-02 ENCOUNTER — Other Ambulatory Visit: Payer: Self-pay

## 2021-08-02 ENCOUNTER — Encounter (INDEPENDENT_AMBULATORY_CARE_PROVIDER_SITE_OTHER): Payer: Self-pay | Admitting: Family Medicine

## 2021-08-02 VITALS — BP 142/88 | HR 70 | Temp 98.0°F | Ht 64.0 in | Wt 268.0 lb

## 2021-08-02 DIAGNOSIS — Z6841 Body Mass Index (BMI) 40.0 and over, adult: Secondary | ICD-10-CM

## 2021-08-02 DIAGNOSIS — E559 Vitamin D deficiency, unspecified: Secondary | ICD-10-CM | POA: Diagnosis not present

## 2021-08-02 DIAGNOSIS — F418 Other specified anxiety disorders: Secondary | ICD-10-CM

## 2021-08-02 DIAGNOSIS — I10 Essential (primary) hypertension: Secondary | ICD-10-CM

## 2021-08-02 MED ORDER — VITAMIN D (ERGOCALCIFEROL) 1.25 MG (50000 UNIT) PO CAPS: 50000.0000 [IU] | ORAL_CAPSULE | ORAL | 0 refills | Status: AC

## 2021-08-02 MED ORDER — BUPROPION HCL ER (SR) 150 MG PO TB12: ORAL_TABLET | ORAL | 0 refills | Status: DC

## 2021-08-02 MED ORDER — ESCITALOPRAM OXALATE 10 MG PO TABS: 10.0000 mg | ORAL_TABLET | Freq: Every day | ORAL | 0 refills | Status: AC

## 2021-08-03 NOTE — Progress Notes (Signed)
Chief Complaint:   OBESITY Gilda is here to discuss her progress with her obesity treatment plan along with follow-up of her obesity related diagnoses. Chasady is on the Category 3 Plan and keeping a food journal and adhering to recommended goals of 400-500 calories and 40 grams of protein daily and states she is following her eating plan approximately 50% of the time. Avangelina states she is walking for 15-30 minutes 2 times per week.  Today's visit was #: 5 Starting weight: 271 lbs Starting date: 02/16/2021 Today's weight: 268 lbs Today's date: 08/02/2021 Total lbs lost to date: 3 Total lbs lost since last in-office visit: 0  Interim History: Jaslyn hasn't been able to concentrate on weight loss for the last 2 week. She is ready to get back on track, but she is struggling with the choices on her Category 3 plan.  Subjective:   1. Vitamin D deficiency Margy is stable on Vit D, and she needs a refill. Her level is not yet at goal.  2. Essential hypertension Sagan's blood pressure is elevated today. She has had increased stress recently. She denies chest pain or headache.  3. Depression with anxiety Monita is working on decreasing emotional eating behaviors. She has been grieving the loss of her aunt, and she hasn't been able to concentrate on weight loss. She is ready to start working on this again.  Assessment/Plan:   1. Vitamin D deficiency Low Vitamin D level contributes to fatigue and are associated with obesity, breast, and colon cancer. We will refill prescription Vitamin D for 1 month. Remmie will follow-up for routine testing of Vitamin D, at least 2-3 times per year to avoid over-replacement.  - Vitamin D, Ergocalciferol, (DRISDOL) 1.25 MG (50000 UNIT) CAPS capsule; Take 1 capsule (50,000 Units total) by mouth every 7 (seven) days.  Dispense: 4 capsule; Refill: 0  2. Essential hypertension Camiya will continue with diet and exercise to improve blood pressure  control. We will recheck her blood pressure in 2-3 weeks.  3. Depression with anxiety Behavior modification techniques were discussed today to help Song deal with her emotional/non-hunger eating behaviors. We will refill Wellbutrin and Lexapro for 1 month. Orders and follow up as documented in patient record.   - buPROPion (WELLBUTRIN SR) 150 MG 12 hr tablet; TAKE 1 TABLET(150 MG) BY MOUTH EVERY MORNING  Dispense: 30 tablet; Refill: 0 - escitalopram (LEXAPRO) 10 MG tablet; Take 1 tablet (10 mg total) by mouth daily.  Dispense: 30 tablet; Refill: 0  4. Obesity with current BMI 46.0 Shereka is currently in the action stage of change. As such, her goal is to continue with weight loss efforts. She has agreed to change to keeping a food journal and adhering to recommended goals of 1400-1700 calories and 90+ grams of protein daily.   Exercise goals: As is.  Behavioral modification strategies: increasing lean protein intake, decreasing simple carbohydrates, and keeping a strict food journal.  Nataley has agreed to follow-up with our clinic in 2 to 3 weeks. She was informed of the importance of frequent follow-up visits to maximize her success with intensive lifestyle modifications for her multiple health conditions.   Objective:   Blood pressure (!) 142/88, pulse 70, temperature 98 F (36.7 C), height 5\' 4"  (1.626 m), weight 268 lb (121.6 kg), SpO2 98 %. Body mass index is 46 kg/m.  General: Cooperative, alert, well developed, in no acute distress. HEENT: Conjunctivae and lids unremarkable. Cardiovascular: Regular rhythm.  Lungs: Normal work of breathing.  Neurologic: No focal deficits.   Lab Results  Component Value Date   CREATININE 1.18 (H) 02/16/2021   BUN 9 02/16/2021   NA 137 02/16/2021   K 3.7 02/16/2021   CL 96 02/16/2021   CO2 27 02/16/2021   Lab Results  Component Value Date   ALT 14 02/16/2021   AST 17 02/16/2021   ALKPHOS 67 02/16/2021   BILITOT 0.5 02/16/2021    Lab Results  Component Value Date   HGBA1C 5.3 02/16/2021   Lab Results  Component Value Date   INSULIN 26.4 (H) 02/16/2021   Lab Results  Component Value Date   TSH 2.320 02/16/2021   Lab Results  Component Value Date   CHOL 197 02/16/2021   HDL 46 02/16/2021   LDLCALC 132 (H) 02/16/2021   TRIG 103 02/16/2021   Lab Results  Component Value Date   VD25OH 28.3 (L) 02/16/2021   Lab Results  Component Value Date   WBC 6.3 02/16/2021   HGB 12.7 02/16/2021   HCT 40.0 02/16/2021   MCV 84 02/16/2021   PLT 359 02/16/2021   No results found for: IRON, TIBC, FERRITIN  Attestation Statements:   Reviewed by clinician on day of visit: allergies, medications, problem list, medical history, surgical history, family history, social history, and previous encounter notes.  Time spent on visit including pre-visit chart review and post-visit care and charting was 45 minutes.    I, Trixie Dredge, am acting as transcriptionist for Dennard Nip, MD.  I have reviewed the above documentation for accuracy and completeness, and I agree with the above. -  Dennard Nip, MD

## 2021-08-25 ENCOUNTER — Ambulatory Visit (INDEPENDENT_AMBULATORY_CARE_PROVIDER_SITE_OTHER): Payer: 59 | Admitting: Family Medicine

## 2021-09-01 ENCOUNTER — Ambulatory Visit (INDEPENDENT_AMBULATORY_CARE_PROVIDER_SITE_OTHER): Payer: 59 | Admitting: Family Medicine

## 2021-09-27 ENCOUNTER — Encounter (INDEPENDENT_AMBULATORY_CARE_PROVIDER_SITE_OTHER): Payer: Self-pay | Admitting: Family Medicine

## 2021-09-27 ENCOUNTER — Other Ambulatory Visit: Payer: Self-pay

## 2021-09-27 ENCOUNTER — Ambulatory Visit (INDEPENDENT_AMBULATORY_CARE_PROVIDER_SITE_OTHER): Payer: 59 | Admitting: Family Medicine

## 2021-09-27 VITALS — BP 178/113 | HR 71 | Temp 97.7°F | Ht 64.0 in | Wt 267.0 lb

## 2021-09-27 DIAGNOSIS — I10 Essential (primary) hypertension: Secondary | ICD-10-CM

## 2021-09-27 DIAGNOSIS — Z6841 Body Mass Index (BMI) 40.0 and over, adult: Secondary | ICD-10-CM

## 2021-09-27 DIAGNOSIS — F418 Other specified anxiety disorders: Secondary | ICD-10-CM

## 2021-09-27 MED ORDER — BUPROPION HCL ER (SR) 150 MG PO TB12: ORAL_TABLET | ORAL | 0 refills | Status: DC

## 2021-09-27 NOTE — Progress Notes (Signed)
Chief Complaint:   OBESITY Elizabeth Guerra is here to discuss her progress with her obesity treatment plan along with follow-up of her obesity related diagnoses. Elizabeth Guerra is on keeping a food journal and adhering to recommended goals of 1400-1700 calories and 90+ grams of protein daily and states she is following her eating plan approximately 25% of the time. Elizabeth Guerra states she is doing some walking, but not consistent.  Today's visit was #: 6 Starting weight: 271 lbs Starting date: 02/16/2021 Today's weight: 267 lbs Today's date: 09/27/2021 Total lbs lost to date: 4 Total lbs lost since last in-office visit: 1  Interim History: Elizabeth Guerra has done very well avoiding Thanksgiving weight gain. She has not been on her formal eating plan, but she has been mindful and she has tried to portion control and make smarter choices.  Subjective:   1. Essential hypertension Antwan's blood pressure is elevated today. She notes increased stress. She is taking her blood pressure medications as prescribed.  2. Depression with anxiety Elizabeth Guerra is doing well on her medications with no side effects noted. She continues to work on identifying and reducing emotional eating behaviors.  Assessment/Plan:   1. Essential hypertension Elizabeth Guerra will continue with diet and exercise to improve blood pressure control. We will recheck her blood pressure in 1 month.   2. Depression with anxiety We will refill Wellbutrin SR for 1 month. Emotional eating behavior strategies were discussed today to help Elizabeth Guerra deal with her emotional/non-hunger eating behaviors. Orders and follow up as documented in patient record.   - buPROPion (WELLBUTRIN SR) 150 MG 12 hr tablet; TAKE 1 TABLET(150 MG) BY MOUTH EVERY MORNING  Dispense: 30 tablet; Refill: 0  3. Obesity with current BMI of 70 Elizabeth Guerra is currently in the action stage of change. As such, her goal is to continue with weight loss efforts. She has agreed to the Category 3 Plan.    Exercise goals: As is.  Behavioral modification strategies: holiday eating strategies .  Elizabeth Guerra has agreed to follow-up with our clinic in 2 weeks. She was informed of the importance of frequent follow-up visits to maximize her success with intensive lifestyle modifications for her multiple health conditions.   Objective:   Blood pressure (!) 178/113, pulse 71, temperature 97.7 F (36.5 C), height 5\' 4"  (1.626 m), weight 267 lb (121.1 kg), SpO2 100 %. Body mass index is 45.83 kg/m.  General: Cooperative, alert, well developed, in no acute distress. HEENT: Conjunctivae and lids unremarkable. Cardiovascular: Regular rhythm.  Lungs: Normal work of breathing. Neurologic: No focal deficits.   Lab Results  Component Value Date   CREATININE 1.18 (H) 02/16/2021   BUN 9 02/16/2021   NA 137 02/16/2021   K 3.7 02/16/2021   CL 96 02/16/2021   CO2 27 02/16/2021   Lab Results  Component Value Date   ALT 14 02/16/2021   AST 17 02/16/2021   ALKPHOS 67 02/16/2021   BILITOT 0.5 02/16/2021   Lab Results  Component Value Date   HGBA1C 5.3 02/16/2021   Lab Results  Component Value Date   INSULIN 26.4 (H) 02/16/2021   Lab Results  Component Value Date   TSH 2.320 02/16/2021   Lab Results  Component Value Date   CHOL 197 02/16/2021   HDL 46 02/16/2021   LDLCALC 132 (H) 02/16/2021   TRIG 103 02/16/2021   Lab Results  Component Value Date   VD25OH 28.3 (L) 02/16/2021   Lab Results  Component Value Date   WBC 6.3  02/16/2021   HGB 12.7 02/16/2021   HCT 40.0 02/16/2021   MCV 84 02/16/2021   PLT 359 02/16/2021   No results found for: IRON, TIBC, FERRITIN  Attestation Statements:   Reviewed by clinician on day of visit: allergies, medications, problem list, medical history, surgical history, family history, social history, and previous encounter notes.  Time spent on visit including pre-visit chart review and post-visit care and charting was 40 minutes.    I, Trixie Dredge, am acting as transcriptionist for Dennard Nip, MD.  I have reviewed the above documentation for accuracy and completeness, and I agree with the above. -  Dennard Nip, MD

## 2021-10-11 ENCOUNTER — Encounter (INDEPENDENT_AMBULATORY_CARE_PROVIDER_SITE_OTHER): Payer: Self-pay

## 2021-10-11 ENCOUNTER — Ambulatory Visit (INDEPENDENT_AMBULATORY_CARE_PROVIDER_SITE_OTHER): Payer: 59 | Admitting: Family Medicine

## 2021-10-31 ENCOUNTER — Other Ambulatory Visit: Payer: Self-pay

## 2021-10-31 ENCOUNTER — Ambulatory Visit (INDEPENDENT_AMBULATORY_CARE_PROVIDER_SITE_OTHER): Payer: 59 | Admitting: Family Medicine

## 2021-10-31 ENCOUNTER — Encounter (INDEPENDENT_AMBULATORY_CARE_PROVIDER_SITE_OTHER): Payer: Self-pay | Admitting: Family Medicine

## 2021-10-31 VITALS — BP 156/82 | HR 80 | Temp 99.8°F | Ht 64.0 in | Wt 271.0 lb

## 2021-10-31 DIAGNOSIS — R609 Edema, unspecified: Secondary | ICD-10-CM

## 2021-10-31 DIAGNOSIS — I10 Essential (primary) hypertension: Secondary | ICD-10-CM

## 2021-10-31 DIAGNOSIS — Z6841 Body Mass Index (BMI) 40.0 and over, adult: Secondary | ICD-10-CM

## 2021-10-31 DIAGNOSIS — F3289 Other specified depressive episodes: Secondary | ICD-10-CM

## 2021-10-31 MED ORDER — VALSARTAN 160 MG PO TABS: 160.0000 mg | ORAL_TABLET | Freq: Two times a day (BID) | ORAL | 0 refills | Status: AC

## 2021-10-31 MED ORDER — AMLODIPINE BESYLATE 5 MG PO TABS: 5.0000 mg | ORAL_TABLET | Freq: Every day | ORAL | 0 refills | Status: DC

## 2021-10-31 MED ORDER — FUROSEMIDE 40 MG PO TABS: 40.0000 mg | ORAL_TABLET | Freq: Two times a day (BID) | ORAL | 0 refills | Status: DC

## 2021-10-31 MED ORDER — CLONIDINE HCL 0.1 MG PO TABS: 0.1000 mg | ORAL_TABLET | Freq: Every day | ORAL | 0 refills | Status: AC | PRN

## 2021-10-31 MED ORDER — BUPROPION HCL ER (SR) 200 MG PO TB12: 200.0000 mg | ORAL_TABLET | Freq: Every day | ORAL | 0 refills | Status: DC

## 2021-10-31 MED ORDER — POTASSIUM CHLORIDE CRYS ER 10 MEQ PO TBCR: 10.0000 meq | EXTENDED_RELEASE_TABLET | Freq: Every day | ORAL | 0 refills | Status: DC

## 2021-11-01 ENCOUNTER — Other Ambulatory Visit (INDEPENDENT_AMBULATORY_CARE_PROVIDER_SITE_OTHER): Payer: Self-pay | Admitting: Family Medicine

## 2021-11-01 DIAGNOSIS — F418 Other specified anxiety disorders: Secondary | ICD-10-CM

## 2021-11-01 NOTE — Progress Notes (Signed)
Chief Complaint:   OBESITY Elizabeth Guerra is here to discuss her progress with her obesity treatment plan along with follow-up of her obesity related diagnoses. Elizabeth Guerra is on the Category 3 Plan and states she is following her eating plan approximately 30% of the time. Elizabeth Guerra states she is doing 0 minutes 0 times per week.  Today's visit was #: 7 Starting weight: 271 lbs Starting date: 02/16/2021 Today's weight: 271 lbs Today's date: 10/31/2021 Total lbs lost to date: 0 Total lbs lost since last in-office visit: 0  Interim History: Elizabeth Guerra was off her normal routine and she did some celebration eating, and more grab and go eating. She is ready to get back on track with her eating plan.  Subjective:   1. Essential hypertension Elizabeth Guerra is running out of her blood pressure medications, and she doesn't have an appointment with her primary care provider yet. She asks if I can refill her medications for now. Her blood pressure is better than previous but still not at goal.  2. Edema, unspecified type Elizabeth Guerra is running low on her Lasix and she requests a refill as she doesn't have an appointment with her primary care provider.  3. Other depression with emotional eating Elizabeth Guerra notes she is still struggling with some cravings and stress eating. It is worse with increased stress. She has no problems with Wellbutrin.  Assessment/Plan:   1. Essential hypertension We will refill Norvasc, clonidine, and valsartan for 1 month. Elizabeth Guerra is to follow up with her primary care provider to continue to manage.  - amLODipine (NORVASC) 5 MG tablet; Take 1 tablet (5 mg total) by mouth daily.  Dispense: 30 tablet; Refill: 0 - cloNIDine (CATAPRES) 0.1 MG tablet; Take 1 tablet (0.1 mg total) by mouth daily as needed (blood pressure of 180/95 or above).  Dispense: 60 tablet; Refill: 0 - valsartan (DIOVAN) 160 MG tablet; Take 1 tablet (160 mg total) by mouth 2 (two) times daily.  Dispense: 60 tablet; Refill:  0  2. Edema, unspecified type We will refill Lasix and KCI for 1 month. Elizabeth Guerra is to follow up with her primary care provider.  - furosemide (LASIX) 40 MG tablet; Take 1 tablet (40 mg total) by mouth 2 (two) times daily.  Dispense: 60 tablet; Refill: 0 - potassium chloride (KLOR-CON M) 10 MEQ tablet; Take 1 tablet (10 mEq total) by mouth daily.  Dispense: 30 tablet; Refill: 0  3. Other depression with emotional eating Elizabeth Guerra agreed to increase Wellbutrin SR to 200 mg daily, and we will refill for 1 month. Behavior modification techniques were discussed today to help Elizabeth Guerra deal with her emotional/non-hunger eating behaviors. Orders and follow up as documented in patient record.   - buPROPion (WELLBUTRIN SR) 200 MG 12 hr tablet; Take 1 tablet (200 mg total) by mouth daily.  Dispense: 30 tablet; Refill: 0  4. Obesity with current BMI of 46.7 Elizabeth Guerra is currently in the action stage of change. As such, her goal is to get back to weightloss efforts . She has agreed to the Category 3 Plan.   Behavioral modification strategies: no skipping meals and meal planning and cooking strategies.  Elizabeth Guerra has agreed to follow-up with our clinic in 3 weeks. She was informed of the importance of frequent follow-up visits to maximize her success with intensive lifestyle modifications for her multiple health conditions.   Objective:   Blood pressure (!) 156/82, pulse 80, temperature 99.8 F (37.7 C), height 5\' 4"  (1.626 m), weight 271 lb (122.9 kg),  SpO2 99 %. Body mass index is 46.52 kg/m.  General: Cooperative, alert, well developed, in no acute distress. HEENT: Conjunctivae and lids unremarkable. Cardiovascular: Regular rhythm.  Lungs: Normal work of breathing. Neurologic: No focal deficits.   Lab Results  Component Value Date   CREATININE 1.18 (H) 02/16/2021   BUN 9 02/16/2021   NA 137 02/16/2021   K 3.7 02/16/2021   CL 96 02/16/2021   CO2 27 02/16/2021   Lab Results  Component Value  Date   ALT 14 02/16/2021   AST 17 02/16/2021   ALKPHOS 67 02/16/2021   BILITOT 0.5 02/16/2021   Lab Results  Component Value Date   HGBA1C 5.3 02/16/2021   Lab Results  Component Value Date   INSULIN 26.4 (H) 02/16/2021   Lab Results  Component Value Date   TSH 2.320 02/16/2021   Lab Results  Component Value Date   CHOL 197 02/16/2021   HDL 46 02/16/2021   LDLCALC 132 (H) 02/16/2021   TRIG 103 02/16/2021   Lab Results  Component Value Date   VD25OH 28.3 (L) 02/16/2021   Lab Results  Component Value Date   WBC 6.3 02/16/2021   HGB 12.7 02/16/2021   HCT 40.0 02/16/2021   MCV 84 02/16/2021   PLT 359 02/16/2021   No results found for: IRON, TIBC, FERRITIN  Attestation Statements:   Reviewed by clinician on day of visit: allergies, medications, problem list, medical history, surgical history, family history, social history, and previous encounter notes.   I, Trixie Dredge, am acting as transcriptionist for Dennard Nip, MD.  I have reviewed the above documentation for accuracy and completeness, and I agree with the above. -  Dennard Nip, MD

## 2021-11-29 ENCOUNTER — Encounter (INDEPENDENT_AMBULATORY_CARE_PROVIDER_SITE_OTHER): Payer: Self-pay | Admitting: Family Medicine

## 2021-11-29 ENCOUNTER — Other Ambulatory Visit: Payer: Self-pay

## 2021-11-29 ENCOUNTER — Ambulatory Visit (INDEPENDENT_AMBULATORY_CARE_PROVIDER_SITE_OTHER): Payer: 59 | Admitting: Family Medicine

## 2021-11-29 VITALS — BP 121/82 | HR 64 | Temp 98.1°F | Ht 64.0 in | Wt 266.0 lb

## 2021-11-29 DIAGNOSIS — Z6841 Body Mass Index (BMI) 40.0 and over, adult: Secondary | ICD-10-CM

## 2021-11-29 DIAGNOSIS — I10 Essential (primary) hypertension: Secondary | ICD-10-CM

## 2021-11-29 DIAGNOSIS — F3289 Other specified depressive episodes: Secondary | ICD-10-CM

## 2021-11-29 DIAGNOSIS — E669 Obesity, unspecified: Secondary | ICD-10-CM | POA: Diagnosis not present

## 2021-11-29 DIAGNOSIS — Z9189 Other specified personal risk factors, not elsewhere classified: Secondary | ICD-10-CM

## 2021-11-29 MED ORDER — BUPROPION HCL ER (SR) 200 MG PO TB12: 200.0000 mg | ORAL_TABLET | Freq: Every day | ORAL | 0 refills | Status: DC

## 2021-11-30 NOTE — Progress Notes (Signed)
Chief Complaint:   OBESITY Elizabeth Guerra is here to discuss her progress with her obesity treatment plan along with follow-up of her obesity related diagnoses. Elizabeth Guerra is on the Category 3 Plan and states she is following her eating plan approximately 70-75% of the time. Elizabeth Guerra states she is walking for 45 minutes 4 times per week.  Today's visit was #: 8 Starting weight: 271 lbs Starting date: 02/16/2021 Today's weight: 266 lbs Today's date: 11/29/2021 Total lbs lost to date: 5 Total lbs lost since last in-office visit: 5  Interim History: Elizabeth Guerra was following 16/8. She is eating between 10 AM to 6 PM. She is meal prepping and packing her own snacks (nuts and pretzels). She is focusing on eating more protein and making healthier choices. She notes since starting, her cravings have decreased, and she has more energy and she is sleeping better. If she eats out, she will eat nuggets at [facility]. She is doing well with walking several days per week.  Subjective:   1. Essential hypertension Elizabeth Guerra is taking Norvasc 5 mg, Clonidine 0.1 mg, and valsartan 150 mg. She denies side effects. Her blood pressure is better today.  2. Other depression with emotional eating Elizabeth Guerra is doing well with Wellbutrin SR 200 mg, and she denies side effects.  3. At risk for heart disease Elizabeth Guerra is at higher than average risk for cardiovascular disease due to obesity.  Assessment/Plan:   1. Essential hypertension Elizabeth Guerra will follow up with her primary care provider. She will continue her medications as directed. She will continue working on dietary changes, exercise, and weight loss.  2. Other depression with emotional eating Behavior modification techniques were discussed today to help Elizabeth Guerra deal with her emotional/non-hunger eating behaviors. We will refill Wellbutrin SR for 1 month. Orders and follow up as documented in patient record.   - buPROPion (WELLBUTRIN SR) 200 MG 12 hr tablet; Take 1  tablet (200 mg total) by mouth daily.  Dispense: 30 tablet; Refill: 0  3. At risk for heart disease Elizabeth Guerra was given approximately 15 minutes of coronary artery disease prevention counseling today. She is 46 y.o. female and has risk factors for heart disease including obesity. We discussed intensive lifestyle modifications today with an emphasis on specific weight loss instructions and strategies.  Repetitive spaced learning was employed today to elicit superior memory formation and behavioral change.   4. Obesity with current BMI of 45.7 Elizabeth Guerra is currently in the action stage of change. As such, her goal is to continue with weight loss efforts. She has agreed to practicing portion control and making smarter food choices, such as increasing vegetables and decreasing simple carbohydrates.   Exercise goals: As is.  Behavioral modification strategies: increasing lean protein intake and increasing water intake.  Elizabeth Guerra has agreed to follow-up with our clinic in 3 weeks. She was informed of the importance of frequent follow-up visits to maximize her success with intensive lifestyle modifications for her multiple health conditions.   Objective:   Blood pressure 121/82, pulse 64, temperature 98.1 F (36.7 C), height 5\' 4"  (1.626 m), weight 266 lb (120.7 kg), SpO2 98 %. Body mass index is 45.66 kg/m.  General: Cooperative, alert, well developed, in no acute distress. HEENT: Conjunctivae and lids unremarkable. Cardiovascular: Regular rhythm.  Lungs: Normal work of breathing. Neurologic: No focal deficits.   Lab Results  Component Value Date   CREATININE 1.18 (H) 02/16/2021   BUN 9 02/16/2021   NA 137 02/16/2021   K 3.7  02/16/2021   CL 96 02/16/2021   CO2 27 02/16/2021   Lab Results  Component Value Date   ALT 14 02/16/2021   AST 17 02/16/2021   ALKPHOS 67 02/16/2021   BILITOT 0.5 02/16/2021   Lab Results  Component Value Date   HGBA1C 5.3 02/16/2021   Lab Results   Component Value Date   INSULIN 26.4 (H) 02/16/2021   Lab Results  Component Value Date   TSH 2.320 02/16/2021   Lab Results  Component Value Date   CHOL 197 02/16/2021   HDL 46 02/16/2021   LDLCALC 132 (H) 02/16/2021   TRIG 103 02/16/2021   Lab Results  Component Value Date   VD25OH 28.3 (L) 02/16/2021   Lab Results  Component Value Date   WBC 6.3 02/16/2021   HGB 12.7 02/16/2021   HCT 40.0 02/16/2021   MCV 84 02/16/2021   PLT 359 02/16/2021   No results found for: IRON, TIBC, FERRITIN  Attestation Statements:   Reviewed by clinician on day of visit: allergies, medications, problem list, medical history, surgical history, family history, social history, and previous encounter notes.   I, Trixie Dredge, am acting as transcriptionist for Dennard Nip, MD.  I have reviewed the above documentation for accuracy and completeness, and I agree with the above. -  Dennard Nip, MD

## 2021-12-05 ENCOUNTER — Other Ambulatory Visit (INDEPENDENT_AMBULATORY_CARE_PROVIDER_SITE_OTHER): Payer: Self-pay | Admitting: Family Medicine

## 2021-12-05 DIAGNOSIS — R609 Edema, unspecified: Secondary | ICD-10-CM

## 2021-12-05 DIAGNOSIS — I10 Essential (primary) hypertension: Secondary | ICD-10-CM

## 2021-12-05 NOTE — Telephone Encounter (Signed)
LAST APPOINTMENT DATE: 11/29/21 NEXT APPOINTMENT DATE: 12/20/21   [id number] [place], [place] - [address] AT [facility] [address] [place] [address] Phone: [phone] Fax: [phone]  [place] [address], [place] - [address] [place] [place] [address] Phone: [phone] Fax: [phone]  Patient is requesting a refill of the following medications: Requested Prescriptions   Pending Prescriptions Disp Refills   amLODipine (NORVASC) 5 MG tablet [Pharmacy Med Name: AMLODIPINE BESYLATE 5MG  TABLETS] 30 tablet 0    Sig: TAKE 1 TABLET(5 MG) BY MOUTH DAILY   furosemide (LASIX) 40 MG tablet [Pharmacy Med Name: FUROSEMIDE 40MG  TABLETS] 60 tablet 0    Sig: TAKE 1 TABLET(40 MG) BY MOUTH TWICE DAILY   potassium chloride (KLOR-CON M) 10 MEQ tablet [Pharmacy Med Name: POTASSIUM CL MICRO 10MEQ ER TABS] 30 tablet 0    Sig: TAKE 1 TABLET(10 MEQ) BY MOUTH DAILY    Date last filled: 10/31/21 Previously prescribed by Dr. Leafy Ro  Lab Results  Component Value Date   HGBA1C 5.3 02/16/2021   Lab Results  Component Value Date   LDLCALC 132 (H) 02/16/2021   CREATININE 1.18 (H) 02/16/2021   Lab Results  Component Value Date   VD25OH 28.3 (L) 02/16/2021    BP Readings from Last 3 Encounters:  11/29/21 121/82  10/31/21 (!) 156/82  09/27/21 (!) 178/113

## 2021-12-19 ENCOUNTER — Other Ambulatory Visit (INDEPENDENT_AMBULATORY_CARE_PROVIDER_SITE_OTHER): Payer: Self-pay | Admitting: Family Medicine

## 2021-12-19 DIAGNOSIS — F3289 Other specified depressive episodes: Secondary | ICD-10-CM

## 2021-12-19 NOTE — Telephone Encounter (Signed)
Dr.Beasley 

## 2021-12-20 ENCOUNTER — Encounter (INDEPENDENT_AMBULATORY_CARE_PROVIDER_SITE_OTHER): Payer: Self-pay | Admitting: Family Medicine

## 2021-12-20 ENCOUNTER — Ambulatory Visit (INDEPENDENT_AMBULATORY_CARE_PROVIDER_SITE_OTHER): Payer: 59 | Admitting: Family Medicine

## 2021-12-20 ENCOUNTER — Other Ambulatory Visit: Payer: Self-pay

## 2021-12-20 VITALS — BP 161/87 | HR 72 | Temp 98.3°F | Ht 64.0 in | Wt 266.0 lb

## 2021-12-20 DIAGNOSIS — I120 Hypertensive chronic kidney disease with stage 5 chronic kidney disease or end stage renal disease: Secondary | ICD-10-CM

## 2021-12-20 DIAGNOSIS — F3289 Other specified depressive episodes: Secondary | ICD-10-CM | POA: Diagnosis not present

## 2021-12-20 DIAGNOSIS — N185 Chronic kidney disease, stage 5: Secondary | ICD-10-CM

## 2021-12-20 DIAGNOSIS — E669 Obesity, unspecified: Secondary | ICD-10-CM

## 2021-12-20 DIAGNOSIS — I10 Essential (primary) hypertension: Secondary | ICD-10-CM

## 2021-12-20 DIAGNOSIS — Z6841 Body Mass Index (BMI) 40.0 and over, adult: Secondary | ICD-10-CM

## 2021-12-20 MED ORDER — BUPROPION HCL ER (SR) 200 MG PO TB12: 200.0000 mg | ORAL_TABLET | Freq: Every day | ORAL | 0 refills | Status: DC

## 2021-12-21 NOTE — Progress Notes (Signed)
Chief Complaint:   OBESITY Elizabeth Guerra is here to discuss her progress with her obesity treatment plan along with follow-up of her obesity related diagnoses. Elizabeth Guerra is on practicing portion control and making smarter food choices, such as increasing vegetables and decreasing simple carbohydrates and states she is following her eating plan approximately 50% of the time. Elizabeth Guerra states she is walking (when she can) for 30-45 minutes 2 times per week.  Today's visit was #: 9 Starting weight: 271 lbs Starting date: 02/16/2021 Today's weight: 266 lbs Today's date: 12/20/2021 Total lbs lost to date: 5 Total lbs lost since last in-office visit: 0  Interim History: Elizabeth Guerra has maintained her weight since her last visit. Sh submitted her application for graduate school at [employer]. She has separated from her husband since her last visit. She is struggling with stress eating. She is skipping meals and not eating enough protein. She is hopeful that she will be able to get back on track over the next couple of weeks.  Subjective:   1. Essential hypertension Katrese's blood pressure is elevated today. She notes she hasn't taken her medications today. She denies chest pain, palpitations, or shortness of breath. She is taking Norvasc 5 mg, clonidine 0.1 mg, valsartan 160 mg. She denies side effects.   2. Hypertensive kidney disease with CKD (chronic kidney disease) stage V (Elizabeth Guerra) Elizabeth Guerra is seeing Nephrology every 6 months for a follow up. Last visit was July 09, 2021.  3. Other depression with emotional eating Elizabeth Guerra is taking Wellbutrin SR 200 mg and she denies side effects.   Assessment/Plan:   1. Essential hypertension Elizabeth Guerra will continue to follow up with her primary care provider, and her Nephrology appointment on March 3rd. She will take her medications as directed. She will continue working on dietary changes, exercise, and weight loss.   2. Hypertensive kidney disease with CKD (chronic  kidney disease) stage V ([place]) Elizabeth Guerra will continue to follow up with Nephrology. She will continue her medications as directed, and plans to have labs in March.  3. Other depression with emotional eating Behavior modification techniques were discussed today to help Elizabeth Guerra deal with her emotional/non-hunger eating behaviors. We will refill Wellbutrin SR for 1 month. Orders and follow up as documented in patient record.   - buPROPion (WELLBUTRIN SR) 200 MG 12 hr tablet; Take 1 tablet (200 mg total) by mouth daily.  Dispense: 30 tablet; Refill: 0  4. Obesity with current BMI of 45.7 Elizabeth Guerra is currently in the action stage of change. As such, her goal is to continue with weight loss efforts. She has agreed to the Category 3 Plan.   Exercise goals: As is.  Behavioral modification strategies: increasing lean protein intake, increasing water intake, no skipping meals, and meal planning and cooking strategies.  Elizabeth Guerra has agreed to follow-up with our clinic in 3 weeks. She was informed of the importance of frequent follow-up visits to maximize her success with intensive lifestyle modifications for her multiple health conditions.   Objective:   Blood pressure (!) 161/87, pulse 72, temperature 98.3 F (36.8 C), height 5\' 4"  (1.626 m), weight 266 lb (120.7 kg), SpO2 99 %. Body mass index is 45.66 kg/m.  General: Cooperative, alert, well developed, in no acute distress. HEENT: Conjunctivae and lids unremarkable. Cardiovascular: Regular rhythm.  Lungs: Normal work of breathing. Neurologic: No focal deficits.   Lab Results  Component Value Date   CREATININE 1.18 (H) 02/16/2021   BUN 9 02/16/2021   NA 137 02/16/2021  K 3.7 02/16/2021   CL 96 02/16/2021   CO2 27 02/16/2021   Lab Results  Component Value Date   ALT 14 02/16/2021   AST 17 02/16/2021   ALKPHOS 67 02/16/2021   BILITOT 0.5 02/16/2021   Lab Results  Component Value Date   HGBA1C 5.3 02/16/2021   Lab Results   Component Value Date   INSULIN 26.4 (H) 02/16/2021   Lab Results  Component Value Date   TSH 2.320 02/16/2021   Lab Results  Component Value Date   CHOL 197 02/16/2021   HDL 46 02/16/2021   LDLCALC 132 (H) 02/16/2021   TRIG 103 02/16/2021   Lab Results  Component Value Date   VD25OH 28.3 (L) 02/16/2021   Lab Results  Component Value Date   WBC 6.3 02/16/2021   HGB 12.7 02/16/2021   HCT 40.0 02/16/2021   MCV 84 02/16/2021   PLT 359 02/16/2021   No results found for: IRON, TIBC, FERRITIN  Attestation Statements:   Reviewed by clinician on day of visit: allergies, medications, problem list, medical history, surgical history, family history, social history, and previous encounter notes.   I, Trixie Dredge, am acting as transcriptionist for Dennard Nip, MD.  I have reviewed the above documentation for accuracy and completeness, and I agree with the above. -  Dennard Nip, MD

## 2022-01-09 ENCOUNTER — Ambulatory Visit (INDEPENDENT_AMBULATORY_CARE_PROVIDER_SITE_OTHER): Payer: 59 | Admitting: Family Medicine

## 2022-01-09 ENCOUNTER — Encounter (INDEPENDENT_AMBULATORY_CARE_PROVIDER_SITE_OTHER): Payer: Self-pay | Admitting: Family Medicine

## 2022-01-09 ENCOUNTER — Other Ambulatory Visit: Payer: Self-pay

## 2022-01-09 VITALS — BP 155/88 | HR 70 | Temp 97.9°F | Ht 64.0 in | Wt 263.0 lb

## 2022-01-09 DIAGNOSIS — F3289 Other specified depressive episodes: Secondary | ICD-10-CM

## 2022-01-09 DIAGNOSIS — Z6841 Body Mass Index (BMI) 40.0 and over, adult: Secondary | ICD-10-CM | POA: Diagnosis not present

## 2022-01-09 DIAGNOSIS — E669 Obesity, unspecified: Secondary | ICD-10-CM

## 2022-01-09 DIAGNOSIS — I10 Essential (primary) hypertension: Secondary | ICD-10-CM

## 2022-01-09 DIAGNOSIS — R609 Edema, unspecified: Secondary | ICD-10-CM

## 2022-01-09 DIAGNOSIS — Z9189 Other specified personal risk factors, not elsewhere classified: Secondary | ICD-10-CM

## 2022-01-09 MED ORDER — CHLORTHALIDONE 25 MG PO TABS: 25.0000 mg | ORAL_TABLET | Freq: Every day | ORAL | 0 refills | Status: AC

## 2022-01-09 MED ORDER — FUROSEMIDE 40 MG PO TABS: ORAL_TABLET | ORAL | 0 refills | Status: AC

## 2022-01-09 MED ORDER — BUPROPION HCL ER (SR) 200 MG PO TB12: 200.0000 mg | ORAL_TABLET | Freq: Every day | ORAL | 0 refills | Status: AC

## 2022-01-12 NOTE — Progress Notes (Signed)
? ? ? ?Chief Complaint:  ? ?OBESITY ?Elizabeth Guerra is here to discuss her progress with her obesity treatment plan along with follow-up of her obesity related diagnoses. Elizabeth Guerra is on the Category 3 Plan and states she is following her eating plan approximately 80% of the time. Elizabeth Guerra states she is more active at home.  ? ?Today's visit was #: 10 ?Starting weight: 271 lbs ?Starting date: 02/16/2021 ?Today's weight: 263 lbs ?Today's date: 01/09/2022 ?Total lbs lost to date: 8 ?Total lbs lost since last in-office visit: 3 ? ?Interim History: Elizabeth Guerra continues to do well with weight loss. She is working on increasing protein and water. She is mindful when she eats out, but she is doing more cooking at home. ? ?Subjective:  ? ?1. Essential hypertension ?Elizabeth Guerra's blood pressure is elevated again today. She tends to retain fluid, but she only takes her Lasix as needed. ? ?2. Other depression with emotional eating ?Elizabeth Guerra is stable on Wellbutrin. Her blood pressure is elevated but this does not seem related to Wellbutrin. ? ?3. At risk for heart disease ?Elizabeth Guerra is at higher than average risk for cardiovascular disease due to obesity. ? ?Assessment/Plan:  ? ?1. Essential hypertension ?Elizabeth Guerra agreed to start chlorthalidone 25 mg q AM with no refills, and we will refill Lasix for 1 month. She will continue Lasix as needed.  ? ?- chlorthalidone (HYGROTON) 25 MG tablet; Take 1 tablet (25 mg total) by mouth daily. Take in the AM.  Dispense: 30 tablet; Refill: 0 ?- furosemide (LASIX) 40 MG tablet; TAKE 1 TABLET(40 MG) BY MOUTH TWICE DAILY, AS NEEDED.  Dispense: 60 tablet; Refill: 0 ? ?2. Other depression with emotional eating ?We will refill Wellbutrin SR for 1 month. Behavior modification techniques were discussed today to help Elizabeth Guerra deal with her emotional/non-hunger eating behaviors.  Orders and follow up as documented in patient record.  ? ?- buPROPion (WELLBUTRIN SR) 200 MG 12 hr tablet; Take 1 tablet (200 mg total) by mouth  daily.  Dispense: 30 tablet; Refill: 0 ? ?3. At risk for heart disease ?Elizabeth Guerra was given approximately 15 minutes of coronary artery disease prevention counseling today. She is 46 y.o. female and has risk factors for heart disease including obesity. We discussed intensive lifestyle modifications today with an emphasis on specific weight loss instructions and strategies. ? ?Repetitive spaced learning was employed today to elicit superior memory formation and behavioral change.  ? ?4. Obesity with current BMI of 45.1 ?Elizabeth Guerra is currently in the action stage of change. As such, her goal is to continue with weight loss efforts. She has agreed to practicing portion control and making smarter food choices, such as increasing vegetables and decreasing simple carbohydrates.  ? ?Exercise goals: As is. ? ?Behavioral modification strategies: increasing water intake. ? ?Elizabeth Guerra has agreed to follow-up with our clinic in 3 weeks. She was informed of the importance of frequent follow-up visits to maximize her success with intensive lifestyle modifications for her multiple health conditions.  ? ?Objective:  ? ?Blood pressure (!) 155/88, pulse 70, temperature 97.9 ?F (36.6 ?C), height '5\' 4"'$  (1.626 m), weight 263 lb (119.3 kg), SpO2 98 %. ?Body mass index is 45.14 kg/m?. ? ?General: Cooperative, alert, well developed, in no acute distress. ?HEENT: Conjunctivae and lids unremarkable. ?Cardiovascular: Regular rhythm.  ?Lungs: Normal work of breathing. ?Neurologic: No focal deficits.  ? ?Lab Results  ?Component Value Date  ? CREATININE 1.18 (H) 02/16/2021  ? BUN 9 02/16/2021  ? NA 137 02/16/2021  ? K 3.7 02/16/2021  ?  CL 96 02/16/2021  ? CO2 27 02/16/2021  ? ?Lab Results  ?Component Value Date  ? ALT 14 02/16/2021  ? AST 17 02/16/2021  ? ALKPHOS 67 02/16/2021  ? BILITOT 0.5 02/16/2021  ? ?Lab Results  ?Component Value Date  ? HGBA1C 5.3 02/16/2021  ? ?Lab Results  ?Component Value Date  ? INSULIN 26.4 (H) 02/16/2021  ? ?Lab Results   ?Component Value Date  ? TSH 2.320 02/16/2021  ? ?Lab Results  ?Component Value Date  ? CHOL 197 02/16/2021  ? HDL 46 02/16/2021  ? LDLCALC 132 (H) 02/16/2021  ? TRIG 103 02/16/2021  ? ?Lab Results  ?Component Value Date  ? VD25OH 28.3 (L) 02/16/2021  ? ?Lab Results  ?Component Value Date  ? WBC 6.3 02/16/2021  ? HGB 12.7 02/16/2021  ? HCT 40.0 02/16/2021  ? MCV 84 02/16/2021  ? PLT 359 02/16/2021  ? ?No results found for: IRON, TIBC, FERRITIN ? ?Attestation Statements:  ? ?Reviewed by clinician on day of visit: allergies, medications, problem list, medical history, surgical history, family history, social history, and previous encounter notes. ? ? ?I, Trixie Dredge, am acting as transcriptionist for Dennard Nip, MD. ? ?I have reviewed the above documentation for accuracy and completeness, and I agree with the above. -  Dennard Nip, MD ? ? ?

## 2022-01-21 ENCOUNTER — Other Ambulatory Visit (INDEPENDENT_AMBULATORY_CARE_PROVIDER_SITE_OTHER): Payer: Self-pay | Admitting: Family Medicine

## 2022-01-21 DIAGNOSIS — F3289 Other specified depressive episodes: Secondary | ICD-10-CM

## 2022-01-30 ENCOUNTER — Ambulatory Visit (INDEPENDENT_AMBULATORY_CARE_PROVIDER_SITE_OTHER): Payer: 59 | Admitting: Family Medicine

## 2022-02-01 ENCOUNTER — Ambulatory Visit (INDEPENDENT_AMBULATORY_CARE_PROVIDER_SITE_OTHER): Payer: 59 | Admitting: Family Medicine

## 2022-02-19 ENCOUNTER — Other Ambulatory Visit (INDEPENDENT_AMBULATORY_CARE_PROVIDER_SITE_OTHER): Payer: Self-pay | Admitting: Family Medicine

## 2022-02-19 DIAGNOSIS — F3289 Other specified depressive episodes: Secondary | ICD-10-CM

## 2022-02-19 DIAGNOSIS — I10 Essential (primary) hypertension: Secondary | ICD-10-CM

## 2022-02-21 ENCOUNTER — Ambulatory Visit (INDEPENDENT_AMBULATORY_CARE_PROVIDER_SITE_OTHER): Payer: 59 | Admitting: Family Medicine

## 2022-04-23 ENCOUNTER — Other Ambulatory Visit (INDEPENDENT_AMBULATORY_CARE_PROVIDER_SITE_OTHER): Payer: Self-pay | Admitting: Family Medicine

## 2022-04-23 DIAGNOSIS — F3289 Other specified depressive episodes: Secondary | ICD-10-CM

## 2022-05-31 ENCOUNTER — Encounter (INDEPENDENT_AMBULATORY_CARE_PROVIDER_SITE_OTHER): Payer: Self-pay

## 2022-07-12 DIAGNOSIS — R69 Illness, unspecified: Secondary | ICD-10-CM | POA: Diagnosis not present

## 2022-07-19 DIAGNOSIS — R69 Illness, unspecified: Secondary | ICD-10-CM | POA: Diagnosis not present

## 2022-07-26 DIAGNOSIS — R69 Illness, unspecified: Secondary | ICD-10-CM | POA: Diagnosis not present

## 2022-08-02 DIAGNOSIS — R69 Illness, unspecified: Secondary | ICD-10-CM | POA: Diagnosis not present

## 2022-08-09 DIAGNOSIS — R69 Illness, unspecified: Secondary | ICD-10-CM | POA: Diagnosis not present

## 2022-08-11 DIAGNOSIS — I10 Essential (primary) hypertension: Secondary | ICD-10-CM | POA: Diagnosis not present

## 2022-08-11 DIAGNOSIS — N760 Acute vaginitis: Secondary | ICD-10-CM | POA: Diagnosis not present

## 2022-08-23 DIAGNOSIS — R69 Illness, unspecified: Secondary | ICD-10-CM | POA: Diagnosis not present

## 2022-08-30 DIAGNOSIS — R69 Illness, unspecified: Secondary | ICD-10-CM | POA: Diagnosis not present

## 2022-09-06 DIAGNOSIS — R69 Illness, unspecified: Secondary | ICD-10-CM | POA: Diagnosis not present

## 2022-09-13 DIAGNOSIS — R69 Illness, unspecified: Secondary | ICD-10-CM | POA: Diagnosis not present

## 2022-09-20 DIAGNOSIS — R69 Illness, unspecified: Secondary | ICD-10-CM | POA: Diagnosis not present

## 2022-09-27 DIAGNOSIS — R69 Illness, unspecified: Secondary | ICD-10-CM | POA: Diagnosis not present

## 2022-10-04 DIAGNOSIS — R69 Illness, unspecified: Secondary | ICD-10-CM | POA: Diagnosis not present

## 2022-10-11 DIAGNOSIS — R69 Illness, unspecified: Secondary | ICD-10-CM | POA: Diagnosis not present

## 2022-11-23 DIAGNOSIS — Z419 Encounter for procedure for purposes other than remedying health state, unspecified: Secondary | ICD-10-CM | POA: Diagnosis not present

## 2022-12-22 DIAGNOSIS — Z419 Encounter for procedure for purposes other than remedying health state, unspecified: Secondary | ICD-10-CM | POA: Diagnosis not present

## 2023-01-09 DIAGNOSIS — N183 Chronic kidney disease, stage 3 unspecified: Secondary | ICD-10-CM | POA: Diagnosis not present

## 2023-01-22 DIAGNOSIS — Z419 Encounter for procedure for purposes other than remedying health state, unspecified: Secondary | ICD-10-CM | POA: Diagnosis not present

## 2023-02-21 DIAGNOSIS — Z419 Encounter for procedure for purposes other than remedying health state, unspecified: Secondary | ICD-10-CM | POA: Diagnosis not present

## 2023-03-24 DIAGNOSIS — Z419 Encounter for procedure for purposes other than remedying health state, unspecified: Secondary | ICD-10-CM | POA: Diagnosis not present

## 2023-04-23 DIAGNOSIS — Z419 Encounter for procedure for purposes other than remedying health state, unspecified: Secondary | ICD-10-CM | POA: Diagnosis not present

## 2023-05-24 DIAGNOSIS — Z419 Encounter for procedure for purposes other than remedying health state, unspecified: Secondary | ICD-10-CM | POA: Diagnosis not present

## 2023-06-24 DIAGNOSIS — Z419 Encounter for procedure for purposes other than remedying health state, unspecified: Secondary | ICD-10-CM | POA: Diagnosis not present
